# Patient Record
Sex: Male | Born: 1960 | Race: White | Hispanic: No | Marital: Married | Smoking: Never smoker
Health system: Southern US, Community
[De-identification: ages and names within clinical notes are randomized; demographics above are authoritative.]

## PROBLEM LIST (undated history)

## (undated) DIAGNOSIS — K219 Gastro-esophageal reflux disease without esophagitis: Secondary | ICD-10-CM

## (undated) DIAGNOSIS — Z460 Encounter for fitting and adjustment of spectacles and contact lenses: Secondary | ICD-10-CM

## (undated) DIAGNOSIS — M51369 Other intervertebral disc degeneration, lumbar region without mention of lumbar back pain or lower extremity pain: Secondary | ICD-10-CM

## (undated) DIAGNOSIS — T7840XA Allergy, unspecified, initial encounter: Secondary | ICD-10-CM

## (undated) DIAGNOSIS — M1990 Unspecified osteoarthritis, unspecified site: Secondary | ICD-10-CM

## (undated) DIAGNOSIS — M5136 Other intervertebral disc degeneration, lumbar region: Secondary | ICD-10-CM

## (undated) DIAGNOSIS — K449 Diaphragmatic hernia without obstruction or gangrene: Secondary | ICD-10-CM

## (undated) DIAGNOSIS — I10 Essential (primary) hypertension: Secondary | ICD-10-CM

## (undated) DIAGNOSIS — Z800 Family history of malignant neoplasm of digestive organs: Secondary | ICD-10-CM

## (undated) DIAGNOSIS — K469 Unspecified abdominal hernia without obstruction or gangrene: Secondary | ICD-10-CM

## (undated) HISTORY — PX: CYSTECTOMY: SUR359

## (undated) HISTORY — DX: Other intervertebral disc degeneration, lumbar region without mention of lumbar back pain or lower extremity pain: M51.369

## (undated) HISTORY — DX: Unspecified abdominal hernia without obstruction or gangrene: K46.9

## (undated) HISTORY — PX: OTHER SURGICAL HISTORY: SHX169

## (undated) HISTORY — PX: COLONOSCOPY: SHX174

## (undated) HISTORY — DX: Encounter for fitting and adjustment of spectacles and contact lenses: Z46.0

## (undated) HISTORY — DX: Allergy, unspecified, initial encounter: T78.40XA

## (undated) HISTORY — DX: Essential (primary) hypertension: I10

## (undated) HISTORY — DX: Diaphragmatic hernia without obstruction or gangrene: K44.9

## (undated) HISTORY — DX: Gastro-esophageal reflux disease without esophagitis: K21.9

## (undated) HISTORY — DX: Family history of malignant neoplasm of digestive organs: Z80.0

## (undated) HISTORY — DX: Other intervertebral disc degeneration, lumbar region: M51.36

---
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
## 2011-02-06 ENCOUNTER — Encounter: Payer: Self-pay | Admitting: Internal Medicine

## 2011-02-06 ENCOUNTER — Other Ambulatory Visit: Payer: Self-pay | Admitting: Internal Medicine

## 2011-02-06 DIAGNOSIS — R599 Enlarged lymph nodes, unspecified: Secondary | ICD-10-CM

## 2011-02-13 ENCOUNTER — Ambulatory Visit
Admission: RE | Admit: 2011-02-13 | Discharge: 2011-02-13 | Disposition: A | Discharge status: Home / Self Care | Payer: Managed Care, Other (non HMO) | Source: Ambulatory Visit | Attending: Internal Medicine | Admitting: Internal Medicine

## 2011-02-13 DIAGNOSIS — R599 Enlarged lymph nodes, unspecified: Secondary | ICD-10-CM

## 2011-02-13 MED ORDER — IOHEXOL 300 MG/ML  SOLN
75.0000 mL | Freq: Once | INTRAMUSCULAR | Status: AC | PRN
  Administered 2011-02-13: 75 mL via INTRAVENOUS

## 2011-03-03 ENCOUNTER — Ambulatory Visit (INDEPENDENT_AMBULATORY_CARE_PROVIDER_SITE_OTHER): Payer: Private Health Insurance - Indemnity | Admitting: General Surgery

## 2011-03-03 ENCOUNTER — Encounter (INDEPENDENT_AMBULATORY_CARE_PROVIDER_SITE_OTHER): Payer: Self-pay | Admitting: General Surgery

## 2011-03-03 VITALS — BP 126/84 | HR 64 | Temp 97.2°F | Ht 70.0 in | Wt 212.8 lb

## 2011-03-03 DIAGNOSIS — K439 Ventral hernia without obstruction or gangrene: Secondary | ICD-10-CM

## 2011-03-03 NOTE — Progress Notes (Signed)
Kevin Hoffman is a 50 y.o. male.    Chief Complaint  Patient presents with  . Other    Eval of ventral hernia    HPI HPI This patient was referred by Dr. Clelia Croft for evaluation of the bulge in his upper abdomen. He states that he noticed this approximately 2 years ago and he thinks that it is enlarging slightly. He has some discomfort in the area when doing exercises on his abdomen but otherwise this caused only mild discomfort and does not particularly bother him. He states it is easily reducible and denies any obstructive symptoms. He has no nausea or vomiting states that his bowels are normal he denies any blood in the stool or melena. He has not had a colonoscopy but is scheduled for a colonoscopy by GI soon. Her  Past Medical History  Diagnosis Date  . Hernia   . Contact lens/glasses fitting     Past Surgical History  Procedure Date  . Tonsilectomy, adenoidectomy, bilateral myringotomy and tubes 1978  . Cystectomy     fatty cyst removed from scalp    Family History  Problem Relation Age of Onset  . Hypertension Mother   . Cancer Mother   . Cancer Father     Social History History  Substance Use Topics  . Smoking status: Never Smoker   . Smokeless tobacco: Not on file  . Alcohol Use: No    Not on File  No current outpatient prescriptions on file.    Review of Systems Review of Systems  Constitutional: Negative.   HENT: Negative.   Eyes: Negative.   Respiratory: Negative.   Cardiovascular: Negative.   Gastrointestinal: Negative.   Genitourinary: Negative.   Musculoskeletal: Negative.   Skin: Negative.   Neurological: Negative.   Endo/Heme/Allergies: Negative.   Psychiatric/Behavioral: Negative.     Physical Exam Physical Exam  Constitutional: He is oriented to person, place, and time. He appears well-developed and well-nourished. No distress.  HENT:  Head: Normocephalic and atraumatic.  Eyes: EOM are normal. Pupils are equal, round, and reactive  to light. Right eye exhibits no discharge. Left eye exhibits no discharge. No scleral icterus.  Neck: Normal range of motion. No tracheal deviation present.  Cardiovascular: Normal rate, regular rhythm and normal heart sounds.   Respiratory: Effort normal and breath sounds normal. No stridor. No respiratory distress. He has no wheezes.  GI: Soft. Bowel sounds are normal. He exhibits no distension and no mass. There is no tenderness. There is no rebound and no guarding.       4-5cm reducible bulge in the epigastrium, Nontender, consistent with epigastric hernia  Musculoskeletal: Normal range of motion.  Neurological: He is alert and oriented to person, place, and time.  Skin: Skin is warm and dry. No rash noted. He is not diaphoretic. No erythema. No pallor.  Psychiatric: He has a normal mood and affect. His behavior is normal. Judgment and thought content normal.     Blood pressure 126/84, pulse 64, temperature 97.2 F (36.2 C), temperature source Temporal, height 5\' 10"  (1.778 m), weight 212 lb 12.8 oz (96.525 kg).  Assessment/Plan Reducible epigastric hernia  He has an easily reducible epigastric hernia on exam. I discussed the options of observation versus surgical repair and he would like to pursue surgical repair of this. We also discussed the options of laparoscopic versus open repair and given the location high in the subxiphoid region, I think that open repair might be a better option for him. We discussed  the risks of infection, bleeding, pain, scarring, recurrence, chronic pain, and the need for future surgery, and bowel injury. He elected to proceed with open epigastric hernia repair.  Lodema Pilot DAVID 03/03/2011, 5:58 PM

## 2011-03-16 ENCOUNTER — Encounter: Payer: Self-pay | Admitting: Internal Medicine

## 2011-03-16 ENCOUNTER — Ambulatory Visit (INDEPENDENT_AMBULATORY_CARE_PROVIDER_SITE_OTHER): Payer: Managed Care, Other (non HMO) | Admitting: Internal Medicine

## 2011-03-16 VITALS — BP 120/82 | HR 72 | Ht 70.0 in | Wt 211.4 lb

## 2011-03-16 DIAGNOSIS — Z800 Family history of malignant neoplasm of digestive organs: Secondary | ICD-10-CM

## 2011-03-16 DIAGNOSIS — K219 Gastro-esophageal reflux disease without esophagitis: Secondary | ICD-10-CM

## 2011-03-16 DIAGNOSIS — R198 Other specified symptoms and signs involving the digestive system and abdomen: Secondary | ICD-10-CM

## 2011-03-16 MED ORDER — PEG-KCL-NACL-NASULF-NA ASC-C 100 G PO SOLR: 1.0000 | Freq: Once | ORAL | Status: DC

## 2011-03-16 NOTE — Progress Notes (Signed)
HISTORY OF PRESENT ILLNESS:  Kevin Hoffman is a 50 y.o. male who presents today regarding several GI complaints and need for screening colonoscopy. Patient has a history of acid reflux for which he takes antacids several times per week. No dysphagia. Reports having undergone an upper GI series in 2006 at which time he was diagnosed with a hiatal hernia. He reports change in bowel habits over the past year. Specifically, less frequent bowel movements. As well some complaints of bloating and gas. Possible hemorrhoids without bleeding. Of importance, his mother was diagnosed with metastatic colon cancer last year. His maternal grandmother also has a history of colon cancer. The patient has not had prior endoscopic evaluations. Review of outside laboratories from 02/06/2011 reveal normal CBC, comprehensive metabolic panel, and sedimentation rate.  REVIEW OF SYSTEMS:  All non-GI ROS negative except for swollen lymph glands.  Past Medical History  Diagnosis Date  . Hernia   . Contact lens/glasses fitting   . Family history of colon cancer   . Hiatal hernia   . GERD (gastroesophageal reflux disease)   . DDD (degenerative disc disease)     Past Surgical History  Procedure Date  . Tonsilectomy, adenoidectomy, bilateral myringotomy and tubes 1978  . Cystectomy     fatty cyst removed from scalp    Social History Kevin Hoffman  reports that he has never smoked. He has never used smokeless tobacco. He reports that he drinks alcohol. He reports that he does not use illicit drugs.  family history includes Alcohol abuse in an unspecified family member; Breast cancer in his maternal grandmother; Colon cancer in his maternal grandmother and mother; Coronary artery disease in his paternal grandfather; Diabetes in his maternal grandfather; Heart disease in an unspecified family member; Hypertension in his mother; and Melanoma in his father.  No Known Allergies     PHYSICAL EXAMINATION: Vital  signs: BP 120/82  Pulse 72  Ht 5\' 10"  (1.778 m)  Wt 211 lb 6.4 oz (95.89 kg)  BMI 30.33 kg/m2  Constitutional: generally well-appearing, no acute distress Psychiatric: alert and oriented x3, cooperative Eyes: extraocular movements intact, anicteric, conjunctiva pink Mouth: oral pharynx moist, no lesions Neck: supple no lymphadenopathy Cardiovascular: heart regular rate and rhythm, no murmur Lungs: clear to auscultation bilaterally Abdomen: soft, nontender, nondistended, no obvious ascites, no peritoneal signs, normal bowel sounds, no organomegaly Rectal: Deferred until colonoscopy Extremities: no lower extremity edema bilaterally Skin: no lesions on visible extremities Neuro: No focal deficits.   ASSESSMENT:  #1. Family history of colon cancer in his mother at age 74 and grandmother #2. Nonspecific change in bowel habits, likely functional #3. Chronic GERD treated with on demand and acid several times per week   PLAN:  #1. Colonoscopy.The nature of the procedure, as well as the risks, benefits, and alternatives were carefully and thoroughly reviewed with the patient. Ample time for discussion and questions allowed. The patient understood, was satisfied, and agreed to proceed. Movi prep prescribed #2. Increase bulk fiber and water consumption #3. Upper endoscopy to screen for Barrett's esophagus.The nature of the procedure, as well as the risks, benefits, and alternatives were carefully and thoroughly reviewed with the patient. Ample time for discussion and questions allowed. The patient understood, was satisfied, and agreed to proceed.

## 2011-03-16 NOTE — Patient Instructions (Signed)
Colon/Endo [facility] 04/03/11 2:30 pm arrive at 1:30 pm Movi prep sent to your pharmacy  Colon/Endo brochures given for you to read.

## 2011-04-03 ENCOUNTER — Encounter: Payer: Self-pay | Admitting: Internal Medicine

## 2011-04-03 ENCOUNTER — Ambulatory Visit (AMBULATORY_SURGERY_CENTER): Payer: Managed Care, Other (non HMO) | Admitting: Internal Medicine

## 2011-04-03 DIAGNOSIS — R198 Other specified symptoms and signs involving the digestive system and abdomen: Secondary | ICD-10-CM

## 2011-04-03 DIAGNOSIS — D126 Benign neoplasm of colon, unspecified: Secondary | ICD-10-CM

## 2011-04-03 DIAGNOSIS — K219 Gastro-esophageal reflux disease without esophagitis: Secondary | ICD-10-CM

## 2011-04-03 DIAGNOSIS — Z800 Family history of malignant neoplasm of digestive organs: Secondary | ICD-10-CM

## 2011-04-03 DIAGNOSIS — Z1211 Encounter for screening for malignant neoplasm of colon: Secondary | ICD-10-CM

## 2011-04-03 MED ORDER — SODIUM CHLORIDE 0.9 % IV SOLN: 500.0000 mL | INTRAVENOUS | Status: DC

## 2011-04-03 MED ORDER — OMEPRAZOLE 40 MG PO CPDR: 40.0000 mg | DELAYED_RELEASE_CAPSULE | Freq: Every day | ORAL | Status: DC

## 2011-04-03 NOTE — Patient Instructions (Signed)
Please read the instructions given to you by your recovery room nurse.  Your pathology results will be mailed to you within 2 weeks.   You need to have another colonoscopy in 3 years due to the polyp amount today and your family history.   Take your new medication omeprazole 40mg  1/2 hr before breakfast every day.   You may resume your routine medications today.  You do need to increase the fiber in your diet.   Call us at [phone] if you have any questions or concerns.   Thank-you for choosing [place] for your healthcare needs today.

## 2011-04-06 ENCOUNTER — Telehealth: Payer: Self-pay | Admitting: *Deleted

## 2011-04-06 NOTE — Telephone Encounter (Signed)
Message left to call if necessary. 

## 2011-04-07 ENCOUNTER — Telehealth: Payer: Self-pay | Admitting: Internal Medicine

## 2011-04-11 ENCOUNTER — Emergency Department (HOSPITAL_COMMUNITY)
Admission: EM | Admit: 2011-04-11 | Discharge: 2011-04-11 | Disposition: A | Discharge status: Home / Self Care | Payer: Managed Care, Other (non HMO) | Attending: Emergency Medicine | Admitting: Emergency Medicine

## 2011-04-11 ENCOUNTER — Emergency Department (HOSPITAL_COMMUNITY): Payer: Managed Care, Other (non HMO)

## 2011-04-11 DIAGNOSIS — IMO0002 Reserved for concepts with insufficient information to code with codable children: Secondary | ICD-10-CM | POA: Insufficient documentation

## 2011-04-11 DIAGNOSIS — K219 Gastro-esophageal reflux disease without esophagitis: Secondary | ICD-10-CM | POA: Insufficient documentation

## 2011-04-11 DIAGNOSIS — Z79899 Other long term (current) drug therapy: Secondary | ICD-10-CM | POA: Insufficient documentation

## 2011-04-11 DIAGNOSIS — S6710XA Crushing injury of unspecified finger(s), initial encounter: Secondary | ICD-10-CM | POA: Insufficient documentation

## 2011-04-15 NOTE — Telephone Encounter (Signed)
Prior authorization sent to [facility] and it was the wrong provider.  The number patient gave me was to [employer].  I will call patient and obtain correct information or call the pharmacy.

## 2011-04-18 NOTE — Op Note (Signed)
Kevin, Hoffman NO.:  [id number]  MEDICAL RECORD NO.:  [id number]  LOCATION:  [facility]                         FACILITY:  [facility]  PHYSICIAN:  Dionne Ano. Adriane Guglielmo, M.D.DATE OF BIRTH:  04/02/1961  DATE OF PROCEDURE: DATE OF DISCHARGE:                              OPERATIVE REPORT   I had the pleasure to see Kevin Hoffman in the [facility] emergency room for evaluation of his left upper extremity.  This patient sustained a crushing injury to the left small finger with open distal phalanx fracture and nail bed, nail plate laceration.  He complains of pain here only.  He denies neck, back, chest, abdominal pain.  Notes no locking, popping, catching, or prior injury to the extremity.  He is awake, alert, and oriented and here with his wife.  I was asked to see him by Dr. Ethelda Chick.  PAST MEDICAL HISTORY:  Recent colonoscopy with noted gastroesophageal reflux disease.  He is currently on Prilosec.  PAST SURGICAL HISTORY:  Tonsillectomy, sebaceous cyst removed in the distant past.  CURRENT MEDICINES:  Prilosec.  ALLERGIES:  None.  He is a nonsmoker.  He has 3 children.  He is healthy 49-year right-hand- dominant male.  PHYSICAL EXAMINATION:  GENERAL:  He is alert and oriented in no acute distress. VITAL SIGNS:  Stable. HEENT:  Within normal limits. CHEST:  Clear. ABDOMEN:  Nontender. EXTREMITIES:  He has stable ligamentous examination about the upper wrist and forearm.  Lower extremity examination is benign.  Left upper extremity shows an open distal phalanx fracture with comminuted distal tip region.  There is no evidence of infection.  There was nail plate injury and nail bed laceration notable.  I have reviewed this at length and its findings.  X-rays show distal phalanx fracture displaced with the open injury. Impression:  Open distal phalanx fracture, left small finger with nail plate and nail bed disarray and an open  fracture.  RECOMMENDATIONS:  I have discussed with him plans for surgical intervention.  He concurred.  He understands risks and benefits as extensively discussed with him.  OPERATION IN DETAIL:  The patient was seen by myself, taken to the procedure suite and underwent intermetacarpal block.  Following this, prepped and draped in usual sterile fashion with 2 separate Betadine scrubs followed by placement of greater than 2 liters of irrigant through the wound.  Following this, the patient had sterile field secured.  Once this was done, I performed I and D of skin, subcutaneous tissue, bone, tendon, and periosteal tissue.  This was an excisional debridement.  Loose bone shards and fragments were removed with combination of curette, knife blade, and orthopedic scissors were used.  Following this, the patient then underwent very careful and cautious approach with nail plate removal.  This was accomplished out difficulty. Once this was done, I then performed open bone setting technique.  This was an open distal phalanx fracture treatment.  Following this, I then performed a volar flap advancement over the area as he had a very stellate punched out wound.  At this time performed nail bed reconstruction with 6-0 chromic suture under 4.0 magnification and repaired the lateral nail fold and the volar pulp with a  volar advancement technique.  He tolerated this well and had good refill at conclusion of the case.  Tourniquet time was less than 45 minutes to an hour.  Refill was excellent.  Adaptic was placed in the eponychium fold. I showed he and his wife the final product/finger.  Thus the operation consisted of: 1. I and D skin, subcutaneous tissue, bone, tendon, and periosteal     tissue.  This was an excisional debridement. 2. Nail plate removal. 3. Nail bed repair. 4. Open treatment, distal phalanx fracture. 5. Volar advancement flap secondary to the very stellate wound.  We     will  see him back in the office in 7 days, gently remove his     dressing, place him in a fingertip protector.  We will place him in     a long finger protector at night and a short DIP protector during     the day.  In addition to this, I will see him in 12 days for repeat     x-rays.  We will begin PIP range of motion during the day, protect     the DIP joint until the wound conditions are healed, then begin DIP     range of motion.  These notes have been discussed and all questions     encouraged and answered.     Dionne Ano. Amanda Pea, M.D.     [facility]  D:  04/11/2011  T:  04/11/2011  Job:  [id number]  Electronically Signed by Dominica Severin M.D. on 04/18/2011 05:30:29 PM

## 2011-04-28 NOTE — Telephone Encounter (Signed)
Called [facility] and received prior-auth for omeprazole 40mg  for 1 year. Per [employer] pt should be able to fill this in about when over-ride codes entered. Pts wife aware.

## 2011-06-30 ENCOUNTER — Encounter (HOSPITAL_COMMUNITY): Payer: Self-pay | Admitting: Pharmacy Technician

## 2011-07-02 ENCOUNTER — Encounter (HOSPITAL_COMMUNITY)
Admission: RE | Admit: 2011-07-02 | Discharge: 2011-07-02 | Disposition: A | Discharge status: Home / Self Care | Payer: Managed Care, Other (non HMO) | Source: Ambulatory Visit | Attending: General Surgery | Admitting: General Surgery

## 2011-07-02 ENCOUNTER — Encounter (HOSPITAL_COMMUNITY): Payer: Self-pay

## 2011-07-02 LAB — CBC
Hemoglobin: 16.3 g/dL (ref 13.0–17.0)
RBC: 5.18 MIL/uL (ref 4.22–5.81)

## 2011-07-02 LAB — SURGICAL PCR SCREEN
MRSA, PCR: NEGATIVE
Staphylococcus aureus: NEGATIVE

## 2011-07-02 NOTE — Patient Instructions (Signed)
20 GORDON VANDUNK  07/02/2011   Your procedure is scheduled on:  07/08/11 120pm-250pm  Report to [facility] at 1120 AM.  Call this number if you have problems the morning of surgery: [phone]   Remember:   Do not eat food:After Midnight.  May have clear liquids:May have clear liquids from 12 midnite until 0700 am then npo.   Clear liquids include soda, tea, black coffee, apple or grape juice, broth.  Take these medicines the morning of surgery with A SIP OF WATER:    Do not wear jewelry,.  Do not wear lotions, powders, or perfumes.     Do not bring valuables to the hospital.  Contacts, dentures or bridgework may not be worn into surgery.  Leave suitcase in the car. After surgery it may be brought to your room.  For patients admitted to the hospital, checkout time is 11:00 AM the day of discharge.      Special Instructions: CHG Shower Use Special Wash: 1/2 bottle night before surgery and 1/2 bottle morning of surgery. shower chin to toes Wash face and private parts with regular soap.    Please read over the following fact sheets that you were given: MRSA Information, coughing and deep breathing exercises and leg exercises.

## 2011-07-08 ENCOUNTER — Encounter (HOSPITAL_COMMUNITY): Payer: Self-pay | Admitting: *Deleted

## 2011-07-08 ENCOUNTER — Encounter (HOSPITAL_COMMUNITY)
Admission: RE | Disposition: A | Discharge status: Home / Self Care | Payer: Self-pay | Source: Ambulatory Visit | Attending: General Surgery

## 2011-07-08 ENCOUNTER — Encounter (HOSPITAL_COMMUNITY): Payer: Self-pay | Admitting: Anesthesiology

## 2011-07-08 ENCOUNTER — Ambulatory Visit (HOSPITAL_COMMUNITY): Payer: Managed Care, Other (non HMO) | Admitting: Anesthesiology

## 2011-07-08 ENCOUNTER — Ambulatory Visit (HOSPITAL_COMMUNITY)
Admission: RE | Admit: 2011-07-08 | Discharge: 2011-07-08 | Disposition: A | Discharge status: Home / Self Care | Payer: Managed Care, Other (non HMO) | Source: Ambulatory Visit | Attending: General Surgery | Admitting: General Surgery

## 2011-07-08 DIAGNOSIS — K439 Ventral hernia without obstruction or gangrene: Secondary | ICD-10-CM

## 2011-07-08 DIAGNOSIS — Z01812 Encounter for preprocedural laboratory examination: Secondary | ICD-10-CM | POA: Insufficient documentation

## 2011-07-08 HISTORY — DX: Unspecified osteoarthritis, unspecified site: M19.90

## 2011-07-08 HISTORY — PX: EPIGASTRIC HERNIA REPAIR: SHX404

## 2011-07-08 SURGERY — REPAIR, HERNIA, EPIGASTRIC, ADULT
Anesthesia: General | Site: Abdomen | Wound class: Clean

## 2011-07-08 MED ORDER — CEFAZOLIN SODIUM 1-5 GM-% IV SOLN
INTRAVENOUS | Status: DC | PRN
  Administered 2011-07-08: 2 g via INTRAVENOUS

## 2011-07-08 MED ORDER — HYDROCODONE-ACETAMINOPHEN 5-500 MG PO TABS: 1.0000 | ORAL_TABLET | ORAL | Status: AC | PRN

## 2011-07-08 MED ORDER — LIDOCAINE HCL (CARDIAC) 20 MG/ML IV SOLN
INTRAVENOUS | Status: DC | PRN
  Administered 2011-07-08: 70 mg via INTRAVENOUS

## 2011-07-08 MED ORDER — MEPERIDINE HCL 50 MG/ML IJ SOLN: 6.2500 mg | INTRAMUSCULAR | Status: DC | PRN

## 2011-07-08 MED ORDER — GLYCOPYRROLATE 0.2 MG/ML IJ SOLN
INTRAMUSCULAR | Status: DC | PRN
  Administered 2011-07-08: .6 mg via INTRAVENOUS

## 2011-07-08 MED ORDER — 0.9 % SODIUM CHLORIDE (POUR BTL) OPTIME
TOPICAL | Status: DC | PRN
  Administered 2011-07-08: 1000 mL

## 2011-07-08 MED ORDER — NEOSTIGMINE METHYLSULFATE 1 MG/ML IJ SOLN
INTRAMUSCULAR | Status: DC | PRN
  Administered 2011-07-08: 4 mg via INTRAVENOUS

## 2011-07-08 MED ORDER — PROPOFOL 10 MG/ML IV EMUL
INTRAVENOUS | Status: DC | PRN
  Administered 2011-07-08: 300 mg via INTRAVENOUS

## 2011-07-08 MED ORDER — LIDOCAINE-EPINEPHRINE 1 %-1:100000 IJ SOLN
INTRAMUSCULAR | Status: AC
  Filled 2011-07-08: qty 1

## 2011-07-08 MED ORDER — FENTANYL CITRATE 0.05 MG/ML IJ SOLN
INTRAMUSCULAR | Status: DC | PRN
  Administered 2011-07-08: 50 ug via INTRAVENOUS
  Administered 2011-07-08 (×2): 100 ug via INTRAVENOUS

## 2011-07-08 MED ORDER — LACTATED RINGERS IV SOLN
INTRAVENOUS | Status: DC
  Administered 2011-07-08 (×2): via INTRAVENOUS
  Administered 2011-07-08: 1000 mL via INTRAVENOUS

## 2011-07-08 MED ORDER — MIDAZOLAM HCL 5 MG/5ML IJ SOLN
INTRAMUSCULAR | Status: DC | PRN
  Administered 2011-07-08: 2 mg via INTRAVENOUS

## 2011-07-08 MED ORDER — FENTANYL CITRATE 0.05 MG/ML IJ SOLN: 25.0000 ug | INTRAMUSCULAR | Status: DC | PRN

## 2011-07-08 MED ORDER — CEFAZOLIN SODIUM-DEXTROSE 2-3 GM-% IV SOLR: 2.0000 g | INTRAVENOUS | Status: DC

## 2011-07-08 MED ORDER — ONDANSETRON HCL 4 MG/2ML IJ SOLN
INTRAMUSCULAR | Status: DC | PRN
  Administered 2011-07-08: 4 mg via INTRAVENOUS

## 2011-07-08 MED ORDER — BUPIVACAINE HCL (PF) 0.25 % IJ SOLN
INTRAMUSCULAR | Status: AC
  Filled 2011-07-08: qty 30

## 2011-07-08 MED ORDER — ROCURONIUM BROMIDE 100 MG/10ML IV SOLN
INTRAVENOUS | Status: DC | PRN
  Administered 2011-07-08: 40 mg via INTRAVENOUS

## 2011-07-08 MED ORDER — PROMETHAZINE HCL 25 MG/ML IJ SOLN: 6.2500 mg | INTRAMUSCULAR | Status: DC | PRN

## 2011-07-08 MED ORDER — BUPIVACAINE HCL (PF) 0.25 % IJ SOLN
INTRAMUSCULAR | Status: DC | PRN
  Administered 2011-07-08: 20 mL

## 2011-07-08 MED ORDER — LIDOCAINE-EPINEPHRINE 1 %-1:100000 IJ SOLN
INTRAMUSCULAR | Status: DC | PRN
  Administered 2011-07-08: 20 mL

## 2011-07-08 MED ORDER — LACTATED RINGERS IV SOLN: INTRAVENOUS | Status: DC

## 2011-07-08 MED ORDER — DEXAMETHASONE SODIUM PHOSPHATE 10 MG/ML IJ SOLN
INTRAMUSCULAR | Status: DC | PRN
  Administered 2011-07-08: 10 mg via INTRAVENOUS

## 2011-07-08 SURGICAL SUPPLY — 36 items
BINDER ABD UNIV 12 45-62 (WOUND CARE) IMPLANT
BINDER ABDOMINAL 46IN 62IN (WOUND CARE)
BLADE EXTENDED COATED 6.5IN (ELECTRODE) IMPLANT
BLADE HEX COATED 2.75 (ELECTRODE) ×2 IMPLANT
CANISTER SUCTION 2500CC (MISCELLANEOUS) ×2 IMPLANT
CLOTH BEACON ORANGE TIMEOUT ST (SAFETY) IMPLANT
COVER SURGICAL LIGHT HANDLE (MISCELLANEOUS) ×2 IMPLANT
DECANTER SPIKE VIAL GLASS SM (MISCELLANEOUS) ×2 IMPLANT
DERMABOND ADVANCED (GAUZE/BANDAGES/DRESSINGS) ×1
DERMABOND ADVANCED .7 DNX12 (GAUZE/BANDAGES/DRESSINGS) ×1 IMPLANT
DRAPE LAPAROSCOPIC ABDOMINAL (DRAPES) ×2 IMPLANT
ELECT REM PT RETURN 9FT ADLT (ELECTROSURGICAL) ×2
ELECTRODE REM PT RTRN 9FT ADLT (ELECTROSURGICAL) ×1 IMPLANT
GLOVE BIOGEL PI IND STRL 7.0 (GLOVE) ×1 IMPLANT
GLOVE BIOGEL PI INDICATOR 7.0 (GLOVE) ×1
GLOVE SURG SIGNA 7.5 PF LTX (GLOVE) ×2 IMPLANT
GOWN STRL NON-REIN LRG LVL3 (GOWN DISPOSABLE) ×2 IMPLANT
GOWN STRL REIN XL XLG (GOWN DISPOSABLE) ×4 IMPLANT
HAND ACTIVATED (MISCELLANEOUS) IMPLANT
KIT BASIN OR (CUSTOM PROCEDURE TRAY) ×2 IMPLANT
NEEDLE HYPO 22GX1.5 SAFETY (NEEDLE) ×2 IMPLANT
NS IRRIG 1000ML POUR BTL (IV SOLUTION) ×2 IMPLANT
PACK GENERAL/GYN (CUSTOM PROCEDURE TRAY) ×2 IMPLANT
PATCH VENTRAL SMALL 4.3 (Mesh Specialty) ×2 IMPLANT
SPONGE GAUZE 4X4 12PLY (GAUZE/BANDAGES/DRESSINGS) ×2 IMPLANT
STAPLER VISISTAT 35W (STAPLE) ×2 IMPLANT
SUT ETHIBOND 0 MO6 C/R (SUTURE) ×8 IMPLANT
SUT NOVA NAB GS-22 2 0 T19 (SUTURE) IMPLANT
SUT PDS AB 1 CTX 36 (SUTURE) IMPLANT
SUT PROLENE 0 CT 2 (SUTURE) IMPLANT
SUT PROLENE 2 0 BLUE (SUTURE) ×2 IMPLANT
SUT SILK 2 0 (SUTURE)
SUT SILK 2-0 18XBRD TIE 12 (SUTURE) IMPLANT
SYR CONTROL 10ML LL (SYRINGE) ×2 IMPLANT
TOWEL OR 17X26 10 PK STRL BLUE (TOWEL DISPOSABLE) ×4 IMPLANT
TRAY FOLEY CATH 14FRSI W/METER (CATHETERS) IMPLANT

## 2011-07-08 NOTE — Brief Op Note (Signed)
07/08/2011  3:54 PM  PATIENT:  Kevin Hoffman  50 y.o. male  PRE-OPERATIVE DIAGNOSIS:  epigastric hernia  POST-OPERATIVE DIAGNOSIS:  epigastric hernia  PROCEDURE:  Procedure(s): HERNIA REPAIR EPIGASTRIC ADULT  SURGEON:  Surgeon(s): Rulon Abide, DO  PHYSICIAN ASSISTANT:   ASSISTANTS: none   ANESTHESIA:   general  EBL:  Total I/O In: 1200 [I.V.:1200] Out: 310 [Urine:300; Blood:10]  BLOOD ADMINISTERED:none  DRAINS: none   LOCAL MEDICATIONS USED:  MARCAINE 20CC and LIDOCAINE 20CC  SPECIMEN:  No Specimen  DISPOSITION OF SPECIMEN:  N/A  COUNTS:  YES  TOURNIQUET:  * No tourniquets in log *  DICTATION: .Other Dictation: Dictation Number [id number]  PLAN OF CARE: Discharge to home after PACU  PATIENT DISPOSITION:  PACU - hemodynamically stable.   Delay start of Pharmacological VTE agent (>24hrs) due to surgical blood loss or risk of bleeding:  {YES/NO/NOT APPLICABLE:20182

## 2011-07-08 NOTE — Op Note (Signed)
Kevin Hoffman, Kevin NO.:  [id number]  MEDICAL RECORD NO.:  [id number]  LOCATION:  [facility]                         FACILITY:  [facility]  PHYSICIAN:  Lodema Pilot, MD       DATE OF BIRTH:  1961/03/26  DATE OF PROCEDURE:  07/08/2011 DATE OF DISCHARGE:  07/08/2011                              OPERATIVE REPORT   PROCEDURE:  Open epigastric hernia repair with mesh.  PREOPERATIVE DIAGNOSIS:  Epigastric hernia.  POSTOPERATIVE DIAGNOSIS:  Epigastric hernia.  SURGEON:  Lodema Pilot, MD  ASSISTANT:  None.  ANESTHESIA:  General endotracheal anesthesia with 40 cc of 1% lidocaine with epinephrine, 0.25% Marcaine in a 50/50 mixture.  FLUIDS:  1200 cc of crystalloid.  ESTIMATED BLOOD LOSS:  Minimal.  DRAINS:  None.  SPECIMENS:  None.  COMPLICATIONS:  None apparent.  FINDINGS:  A 1.5 cm fascial defect with a reducible preperitoneal fat and placement of 4.3 cm x 4.3 cm Proceed ventral patch.  INDICATION OF PROCEDURE:  Kevin Hoffman is a 50 year old male with a reducible epigastric hernia who has epigastric hernia on exam and desires repair.  OPERATIVE DETAILS:  Kevin Hoffman was seen and evaluated in the preoperative area and risks and benefits of the procedure were again discussed in lay terms and informed consent was obtained.  Surgical site was marked to the patient preoperatively, given prophylactic antibiotics, taken to the operating room, and placed on table in supine position.  General endotracheal anesthesia was obtained and his abdomen was prepped and draped in a standard surgical fashion.  Midline incision was made in the skin and dissection carried down to the subcutaneous tissue using Bovie electrocautery.  Immediately underlying the skin and subcutaneous tissue, was a hernia sac which contained fair amount of preperitoneal fat which was herniated through the fascial defect.  This was dissected circumferentially and the fatty contents were reduced  back into the preperitoneal space and he was left with approximately 1.5 cm fascial defect.  The fascial edges appeared clean and well defined and appeared to have strong fascia.  The fascia was cleared circumferentially and the undersurface of the fascia was cleared circumferentially using blunt dissection.  I decided to place a 4.3 cm x 4.3 cm Proceed ventral patch into the preperitoneal space to minimize the chance of recurrence and 2-0 Prolene sutures were placed at the 12 o'clock, 3 o'clock, 6 o'clock position on the mesh and the repair shooted up through the abdominal wall fascia under direct visualization, taking care to avoid injury to underlying bowel contents.  Each suture was placed under direct visualization and again the peritoneum was not entered.  The patch was placed in the preperitoneal space and sutures were pulled tight and secured and the mesh appeared to lie flat up against the undersurface of the abdominal wall fascia.  The sutures again were secured and there was no evidence of bowel contents 2 sutures were herniating up around through the hernia patch and the tails of the patch were cut and 0 Ethibond sutures were placed through the fascia, then through the tails of the mesh and through the patch centrally and the hernia defect was closed with multiple interrupted 0 Ethibond sutures.  The fascia  was well approximated over the mesh and the wound was inspected for hemostasis which was noted to be adequate.  It was irrigated with sterile saline solution and the wound was injected with 40 cc of 1% lidocaine with epinephrine, 0.25% Marcaine in a 50/50 mixture.  Then the space which was previously filled with hernia contents was obliterated with Bovie electrocautery and a couple of 3-0 Vicryl sutures were placed to approximate the subcutaneous tissues and closed the dead space.  Few interrupted dermal sutures were placed with 3-0 Vicryl suture and the skin edges were  approximated with 4-0 Monocryl subcuticular suture.  Skin was washed and dried and Dermabond was applied.  All sponge, needle, and instrument counts were correct at the end of the case.  The patient tolerated the procedure well without apparent complication.          ______________________________ Lodema Pilot, MD     BL/MEDQ  D:  07/08/2011  T:  07/08/2011  Job:  [id number]

## 2011-07-08 NOTE — Anesthesia Preprocedure Evaluation (Signed)
Anesthesia Evaluation  Patient identified by MRN, date of birth, ID band Patient awake    Reviewed: Allergy & Precautions, H&P , NPO status , Patient's Chart, lab work & pertinent test results  Airway Mallampati: II TM Distance: >3 FB Neck ROM: Full    Dental No notable dental hx.    Pulmonary neg pulmonary ROS,  clear to auscultation  Pulmonary exam normal       Cardiovascular neg cardio ROS Regular Normal    Neuro/Psych Negative Neurological ROS  Negative Psych ROS   GI/Hepatic negative GI ROS, Neg liver ROS, hiatal hernia, GERD-  Medicated and Controlled,  Endo/Other  Negative Endocrine ROS  Renal/GU negative Renal ROS  Genitourinary negative   Musculoskeletal negative musculoskeletal ROS (+)   Abdominal   Peds negative pediatric ROS (+)  Hematology negative hematology ROS (+)   Anesthesia Other Findings   Reproductive/Obstetrics negative OB ROS                           Anesthesia Physical Anesthesia Plan  ASA: II  Anesthesia Plan: General   Post-op Pain Management:    Induction: Intravenous  Airway Management Planned: Oral ETT  Additional Equipment:   Intra-op Plan:   Post-operative Plan: Extubation in OR  Informed Consent: I have reviewed the patients History and Physical, chart, labs and discussed the procedure including the risks, benefits and alternatives for the proposed anesthesia with the patient or authorized representative who has indicated his/her understanding and acceptance.   Dental advisory given  Plan Discussed with: CRNA  Anesthesia Plan Comments:         Anesthesia Quick Evaluation

## 2011-07-08 NOTE — Transfer of Care (Signed)
Immediate Anesthesia Transfer of Care Note  Patient: Kevin Hoffman  Procedure(s) Performed:  HERNIA REPAIR EPIGASTRIC ADULT - with mesh  Patient Location: PACU  Anesthesia Type: General  Level of Consciousness: sedated  Airway & Oxygen Therapy: Patient Spontanous Breathing and Patient connected to face mask  Post-op Assessment: Report given to PACU RN and Post -op Vital signs reviewed and stable  Post vital signs: Reviewed and stable  Complications: No apparent anesthesia complications

## 2011-07-08 NOTE — Anesthesia Postprocedure Evaluation (Signed)
  Anesthesia Post-op Note  Patient: Kevin Hoffman  Procedure(s) Performed:  HERNIA REPAIR EPIGASTRIC ADULT - with mesh  Patient Location: PACU  Anesthesia Type: General  Level of Consciousness: awake and alert   Airway and Oxygen Therapy: Patient Spontanous Breathing  Post-op Pain: mild  Post-op Assessment: Post-op Vital signs reviewed, Patient's Cardiovascular Status Stable, Respiratory Function Stable, Patent Airway and No signs of Nausea or vomiting  Post-op Vital Signs: stable  Complications: No apparent anesthesia complications

## 2011-07-08 NOTE — H&P (Signed)
  HPI  HPI  This patient was referred by Dr. Clelia Croft for evaluation of the bulge in his upper abdomen. He states that he noticed this approximately 2 years ago and he thinks that it is enlarging slightly. He has some discomfort in the area when doing exercises on his abdomen but otherwise this caused only mild discomfort and does not particularly bother him. He states it is easily reducible and denies any obstructive symptoms. He has no nausea or vomiting states that his bowels are normal he denies any blood in the stool or melena. He has not had a colonoscopy but is scheduled for a colonoscopy by GI soon. Her  Past Medical History   Diagnosis  Date   .  Hernia    .  Contact lens/glasses fitting     Past Surgical History   Procedure  Date   .  Tonsilectomy, adenoidectomy, bilateral myringotomy and tubes  1978   .  Cystectomy      fatty cyst removed from scalp    Family History   Problem  Relation  Age of Onset   .  Hypertension  Mother    .  Cancer  Mother    .  Cancer  Father    Social History  History   Substance Use Topics   .  Smoking status:  Never Smoker   .  Smokeless tobacco:  Not on file   .  Alcohol Use:  No   Not on File  No current outpatient prescriptions on file.   Review of Systems  Review of Systems  Constitutional: Negative.  HENT: Negative.  Eyes: Negative.  Respiratory: Negative.  Cardiovascular: Negative.  Gastrointestinal: Negative.  Genitourinary: Negative.  Musculoskeletal: Negative.  Skin: Negative.  Neurological: Negative.  Endo/Heme/Allergies: Negative.  Psychiatric/Behavioral: Negative.  Physical Exam  Physical Exam  Constitutional: He is oriented to person, place, and time. He appears well-developed and well-nourished. No distress.  HENT:  Head: Normocephalic and atraumatic.  Eyes: EOM are normal. Pupils are equal, round, and reactive to light. Right eye exhibits no discharge. Left eye exhibits no discharge. No scleral icterus.  Neck: Normal  range of motion. No tracheal deviation present.  Cardiovascular: Normal rate, regular rhythm and normal heart sounds.  Respiratory: Effort normal and breath sounds normal. No stridor. No respiratory distress. He has no wheezes.  GI: Soft. Bowel sounds are normal. He exhibits no distension and no mass. There is no tenderness. There is no rebound and no guarding.  4-5cm reducible bulge in the epigastrium, Nontender, consistent with epigastric hernia  Musculoskeletal: Normal range of motion.  Neurological: He is alert and oriented to person, place, and time.  Skin: Skin is warm and dry. No rash noted. He is not diaphoretic. No erythema. No pallor.  Psychiatric: He has a normal mood and affect. His behavior is normal. Judgment and thought content normal.    Assessment/Plan  Reducible epigastric hernia Pt seen and examined in the preop area. Risks of procedure discussed in lay terms and site marked with the patient and he desires to proceed with open epigastric hernia repair with mesh.  Risks of infection, bleeding, pain, scarring, recurrence, injury to bowel discussed and he desires to proceed with planned procedure.

## 2011-07-10 ENCOUNTER — Encounter (HOSPITAL_COMMUNITY): Payer: Self-pay | Admitting: General Surgery

## 2011-08-04 ENCOUNTER — Encounter (INDEPENDENT_AMBULATORY_CARE_PROVIDER_SITE_OTHER): Payer: Self-pay | Admitting: General Surgery

## 2011-08-04 ENCOUNTER — Ambulatory Visit (INDEPENDENT_AMBULATORY_CARE_PROVIDER_SITE_OTHER): Payer: Managed Care, Other (non HMO) | Admitting: General Surgery

## 2011-08-04 VITALS — BP 118/76 | HR 68 | Temp 97.6°F | Resp 16 | Ht 70.0 in | Wt 211.6 lb

## 2011-08-04 DIAGNOSIS — Z5189 Encounter for other specified aftercare: Secondary | ICD-10-CM

## 2011-08-04 DIAGNOSIS — Z4889 Encounter for other specified surgical aftercare: Secondary | ICD-10-CM

## 2011-08-04 NOTE — Progress Notes (Signed)
Subjective:     Patient ID: Kevin Hoffman, male   DOB: 1961-04-03, 51 y.o.   MRN: [id number]  HPI He follows up today status post epigastric hernia repair with mesh. He has no complaints and is doing very well. Is not taking any pain medication and he is tolerating regular diet and his bowels are functioning normally.  Review of Systems     Objective:   Physical Exam His incision is healing well without sign of infection no evidence of recurrent hernia.    Assessment:     Status post epigastric hernia repair with mesh-doing well    Plan:     History very well has no complaints he can gradually increase activity as tolerated and return to see me on a p.r.n. basis.

## 2011-09-21 ENCOUNTER — Other Ambulatory Visit: Payer: Self-pay

## 2011-09-21 MED ORDER — OMEPRAZOLE 40 MG PO CPDR: 40.0000 mg | DELAYED_RELEASE_CAPSULE | Freq: Every day | ORAL | Status: DC

## 2012-05-02 ENCOUNTER — Emergency Department (HOSPITAL_COMMUNITY)
Admission: EM | Admit: 2012-05-02 | Discharge: 2012-05-02 | Disposition: A | Discharge status: Home / Self Care | Payer: Managed Care, Other (non HMO) | Attending: Emergency Medicine | Admitting: Emergency Medicine

## 2012-05-02 ENCOUNTER — Emergency Department (HOSPITAL_COMMUNITY): Payer: Managed Care, Other (non HMO)

## 2012-05-02 ENCOUNTER — Encounter (HOSPITAL_COMMUNITY): Payer: Self-pay | Admitting: *Deleted

## 2012-05-02 DIAGNOSIS — R079 Chest pain, unspecified: Secondary | ICD-10-CM | POA: Insufficient documentation

## 2012-05-02 DIAGNOSIS — K219 Gastro-esophageal reflux disease without esophagitis: Secondary | ICD-10-CM | POA: Insufficient documentation

## 2012-05-02 LAB — POCT I-STAT, CHEM 8
BUN: 11 mg/dL (ref 6–23)
Calcium, Ion: 1.2 mmol/L (ref 1.12–1.23)
Chloride: 101 mEq/L (ref 96–112)
Creatinine, Ser: 1 mg/dL (ref 0.50–1.35)
Glucose, Bld: 96 mg/dL (ref 70–99)
HCT: 45 % (ref 39.0–52.0)

## 2012-05-02 LAB — CBC WITH DIFFERENTIAL/PLATELET
Basophils Relative: 0 % (ref 0–1)
MCV: 83.2 fL (ref 78.0–100.0)
Monocytes Absolute: 0.3 10*3/uL (ref 0.1–1.0)
Monocytes Relative: 6 % (ref 3–12)
Neutrophils Relative %: 64 % (ref 43–77)
Platelets: 181 10*3/uL (ref 150–400)
RBC: 5.18 MIL/uL (ref 4.22–5.81)
RDW: 11.8 % (ref 11.5–15.5)
WBC: 4.4 10*3/uL (ref 4.0–10.5)

## 2012-05-02 MED ORDER — NITROGLYCERIN 0.4 MG SL SUBL
0.4000 mg | SUBLINGUAL_TABLET | Freq: Once | SUBLINGUAL | Status: AC
  Administered 2012-05-02: 0.4 mg via SUBLINGUAL

## 2012-05-02 MED ORDER — NITROGLYCERIN 0.4 MG SL SUBL
SUBLINGUAL_TABLET | SUBLINGUAL | Status: AC
  Administered 2012-05-02: 0.4 mg via SUBLINGUAL
  Filled 2012-05-02: qty 25

## 2012-05-02 MED ORDER — METOPROLOL TARTRATE 25 MG PO TABS
100.0000 mg | ORAL_TABLET | Freq: Once | ORAL | Status: AC
  Administered 2012-05-02: 100 mg via ORAL
  Filled 2012-05-02: qty 4

## 2012-05-02 MED ORDER — METOPROLOL TARTRATE 1 MG/ML IV SOLN
INTRAVENOUS | Status: AC
  Filled 2012-05-02: qty 10

## 2012-05-02 MED ORDER — IOHEXOL 350 MG/ML SOLN
80.0000 mL | Freq: Once | INTRAVENOUS | Status: AC | PRN
  Administered 2012-05-02: 80 mL via INTRAVENOUS

## 2012-05-02 NOTE — ED Provider Notes (Signed)
History     CSN: [id number]  Arrival date & time 05/02/12  1103   First MD Initiated Contact with Patient 05/02/12 1217      Chief Complaint  Patient presents with  . Chest Pain    (Consider location/radiation/quality/duration/timing/severity/associated sxs/prior treatment) HPI Pt with no prior CAD history reports he was at work today when he began to feel SOB and tingling. He then had about 10-15 minutes of midsternal moderate chest pain/pressure which has since resolved. He is asymptomatic in the ED. No particular provoking or relieving factors.   Past Medical History  Diagnosis Date  . Hernia   . Contact lens/glasses fitting   . Family history of colon cancer   . Hiatal hernia   . GERD (gastroesophageal reflux disease)   . Arthritis     Past Surgical History  Procedure Date  . Tonsilectomy, adenoidectomy, bilateral myringotomy and tubes 1978  . Cystectomy     fatty cyst removed from scalp  . Other surgical history     left 5th finger crushed injury surgery in ER   . Epigastric hernia repair 07/08/2011    Procedure: HERNIA REPAIR EPIGASTRIC ADULT;  Surgeon: Rulon Abide, DO;  Location: [facility];  Service: General;  Laterality: N/A;  with mesh    Family History  Problem Relation Age of Onset  . Hypertension Mother   . Colon cancer Mother     Maternal Grandmother  . Alcohol abuse    . Breast cancer Maternal Grandmother   . Colon cancer Maternal Grandmother   . Coronary artery disease Paternal Grandfather   . Diabetes Maternal Grandfather   . Melanoma Father   . Heart disease      Paternal    History  Substance Use Topics  . Smoking status: Never Smoker   . Smokeless tobacco: Never Used  . Alcohol Use: 6.0 oz/week    10 Glasses of wine per week     occ      Review of Systems All other systems reviewed and are negative except as noted in HPI.   Allergies  Review of patient's allergies indicates no known allergies.  Home Medications   Current  Outpatient Rx  Name Route Sig Dispense Refill  . VITAMIN C 1000 MG PO TABS Oral Take 1,000 mg by mouth daily.     . IBUPROFEN 200 MG PO TABS Oral Take 400-600 mg by mouth every 6 (six) hours as needed. PAIN     . LOTEMAX 0.5 % OP OINT  as needed.    . MELOXICAM 15 MG PO TABS Oral Take 15 mg by mouth daily as needed. PAIN     . NAPHAZOLINE HCL 0.012 % OP SOLN Both Eyes Place 1-2 drops into both eyes 2 (two) times daily as needed. DRY EYES     . OMEPRAZOLE 40 MG PO CPDR Oral Take 1 capsule (40 mg total) by mouth daily. Take 1/2 hr before breakfast 90 capsule 1    BP 134/86  Pulse 71  Temp 98 F (36.7 C) (Oral)  Resp 10  Ht 5\' 9"  (1.753 m)  Wt 218 lb (98.884 kg)  BMI 32.19 kg/m2  SpO2 99%  Physical Exam  Nursing note and vitals reviewed. Constitutional: He is oriented to person, place, and time. He appears well-developed and well-nourished.  HENT:  Head: Normocephalic and atraumatic.  Eyes: EOM are normal. Pupils are equal, round, and reactive to light.  Neck: Normal range of motion. Neck supple.  Cardiovascular: Normal rate, normal  heart sounds and intact distal pulses.   Pulmonary/Chest: Effort normal and breath sounds normal.  Abdominal: Bowel sounds are normal. He exhibits no distension. There is no tenderness.  Musculoskeletal: Normal range of motion. He exhibits no edema and no tenderness.  Neurological: He is alert and oriented to person, place, and time. He has normal strength. No cranial nerve deficit or sensory deficit.  Skin: Skin is warm and dry. No rash noted.  Psychiatric: He has a normal mood and affect.    ED Course  Procedures (including critical care time)  Labs Reviewed  CBC WITH DIFFERENTIAL - Abnormal; Notable for the following:    MCHC 37.6 (*)  RULED OUT INTERFERING SUBSTANCES   All other components within normal limits  POCT I-STAT, CHEM 8  POCT I-STAT TROPONIN I  TROPONIN I   Dg Chest 2 View  05/02/2012  *RADIOLOGY REPORT*  Clinical Data:  Chest pain and cough.  CHEST - 2 VIEW  Comparison: None  Findings: The cardiac silhouette, mediastinal and hilar contours are normal.  The lungs are clear.  No pleural effusion.  The bony thorax is intact.  IMPRESSION: Normal chest x-ray.   Original Report Authenticated By: P. Loralie Champagne, M.D.      No diagnosis found.    MDM   Date: 05/02/2012  Rate: 78  Rhythm: normal sinus rhythm  QRS Axis: normal  Intervals: normal  ST/T Wave abnormalities: normal  Conduction Disutrbances:iRBBB  Narrative Interpretation:   Old EKG Reviewed: none available   3:20 PM PT with normal Trop, unremarkable EKG. Discussed further ED evaluation with the patient who agrees to stay here for CT Coronary if it can be done today. Moved to CDU, discussed with Laveda Norman, [place]       Bonnita Levan. Bernette Mayers, MD 05/02/12 1521

## 2012-05-02 NOTE — ED Provider Notes (Signed)
Patient has completed his coronary CT--results as follows:    1. Negative for coronary artery disease. The patient's total coronary artery calcium score is 0, which is 0 percentile for patient's matched age and gender. 2. No significant extracardiac abnormality. 3. Right coronary artery dominance.   Patient remains symptom-free.  Cardiac markers negative.  I-stat 8 within normal limits.  Results reviewed with Dr. Bernette Mayers, shared with patient.  Patient's only current history is GERD, for which he takes prilosec.  Clelia Croft is his PCP.  Patient resting comfortably on exam.  Lungs CTA bilaterally.  S1/S2, RRR.  Abdomen soft, bowel sounds present.  Monitor reveals NSR without ectopy.  Patient will be discharged home with follow-up by his PCP.   Jimmye Norman, NP 05/02/12 [phone]

## 2012-05-02 NOTE — ED Notes (Signed)
Patient states no pain at this time , patient states no pain post initial attack, patient resting in NAD at this time

## 2012-05-02 NOTE — ED Notes (Signed)
PT RETURNED FROM CT WITH RN. TOLERATED WELL

## 2012-05-02 NOTE — ED Provider Notes (Signed)
patient care report received from Dr. Bernette Mayers. Patient is being sent to CDU while waiting for chest pain workup with coronary CT scan today. Initially presents with shortness of breath and chest discomfort. This symptom has resolved.  On examination he appeared in good health and spirits. Vital signs as documented. Skin warm and dry and without overt rashes. Neck without JVD. Lungs clear. Heart exam notable for regular rhythm, normal sounds and absence of murmurs, rubs or gallops. Abdomen unremarkable and without evidence of organomegaly, masses, or abdominal aortic enlargement. Extremities nonedematous.  3:23 PM Report given to oncoming CDU provider, who will continue pt care.  BP 126/82  Pulse 60  Temp 98 F (36.7 C) (Oral)  Resp 17  Ht 5\' 9"  (1.753 m)  Wt 218 lb (98.884 kg)  BMI 32.19 kg/m2  SpO2 97%  Nursing notes reviewed and considered in documentation  Previous records reviewed and considered  All labs/vitals reviewed and considered  xrays reviewed and considered   Results for orders placed during the hospital encounter of 05/02/12  CBC WITH DIFFERENTIAL      Component Value Range   WBC 4.4  4.0 - 10.5 K/uL   RBC 5.18  4.22 - 5.81 MIL/uL   Hemoglobin 16.2  13.0 - 17.0 g/dL   HCT 16.1  09.6 - 04.5 %   MCV 83.2  78.0 - 100.0 fL   MCH 31.3  26.0 - 34.0 pg   MCHC 37.6 (*) 30.0 - 36.0 g/dL   RDW 40.9  81.1 - 91.4 %   Platelets 181  150 - 400 K/uL   Neutrophils Relative 64  43 - 77 %   Neutro Abs 2.8  1.7 - 7.7 K/uL   Lymphocytes Relative 29  12 - 46 %   Lymphs Abs 1.3  0.7 - 4.0 K/uL   Monocytes Relative 6  3 - 12 %   Monocytes Absolute 0.3  0.1 - 1.0 K/uL   Eosinophils Relative 1  0 - 5 %   Eosinophils Absolute 0.0  0.0 - 0.7 K/uL   Basophils Relative 0  0 - 1 %   Basophils Absolute 0.0  0.0 - 0.1 K/uL  POCT I-STAT, CHEM 8      Component Value Range   Sodium 139  135 - 145 mEq/L   Potassium 4.1  3.5 - 5.1 mEq/L   Chloride 101  96 - 112 mEq/L   BUN 11  6 - 23  mg/dL   Creatinine, Ser 7.82  0.50 - 1.35 mg/dL   Glucose, Bld 96  70 - 99 mg/dL   Calcium, Ion 9.56  1.12 - 1.23 mmol/L   TCO2 25  0 - 100 mmol/L   Hemoglobin 15.3  13.0 - 17.0 g/dL   HCT 21.3  08.6 - 57.8 %  POCT I-STAT TROPONIN I      Component Value Range   Troponin i, poc 0.00  0.00 - 0.08 ng/mL   Comment 3           TROPONIN I      Component Value Range   Troponin I <0.30  <0.30 ng/mL   Dg Chest 2 View  05/02/2012  *RADIOLOGY REPORT*  Clinical Data: Chest pain and cough.  CHEST - 2 VIEW  Comparison: None  Findings: The cardiac silhouette, mediastinal and hilar contours are normal.  The lungs are clear.  No pleural effusion.  The bony thorax is intact.  IMPRESSION: Normal chest x-ray.   Original Report Authenticated By: P. MARK  Pecolia Ades, M.D.       Fayrene Helper, PA-C 05/02/12 1525

## 2012-05-02 NOTE — ED Notes (Signed)
PT BMI IS 32.2

## 2012-05-02 NOTE — ED Provider Notes (Signed)
Medical screening examination/treatment/procedure(s) were conducted as a shared visit with non-physician practitioner(s) and myself.  I personally evaluated the patient during the encounter   Charles B. Sheldon, MD 05/02/12 1536 

## 2012-05-02 NOTE — ED Notes (Signed)
Pt is here with pain to center of chest and felt short of breath with tingling all over.  No pain now

## 2012-05-02 NOTE — ED Notes (Signed)
Urine sample collected if needed. 

## 2012-05-03 NOTE — ED Provider Notes (Signed)
Medical screening examination/treatment/procedure(s) were conducted as a shared visit with non-physician practitioner(s) and myself.  I personally evaluated the patient during the encounter   Suliman Termini B. Bernette Mayers, MD 05/03/12 1013

## 2012-05-21 ENCOUNTER — Other Ambulatory Visit: Payer: Self-pay | Admitting: Internal Medicine

## 2013-01-10 ENCOUNTER — Other Ambulatory Visit: Payer: Self-pay | Admitting: Internal Medicine

## 2013-07-08 ENCOUNTER — Other Ambulatory Visit: Payer: Self-pay | Admitting: Internal Medicine

## 2013-09-09 ENCOUNTER — Other Ambulatory Visit: Payer: Self-pay | Admitting: Internal Medicine

## 2013-10-08 IMAGING — CR DG CHEST 2V
2 series · 2 of 2 positions shown · non-contrast
Comparison: None

CLINICAL DATA: Chest pain and cough.

CHEST - 2 VIEW

[w chest pa]
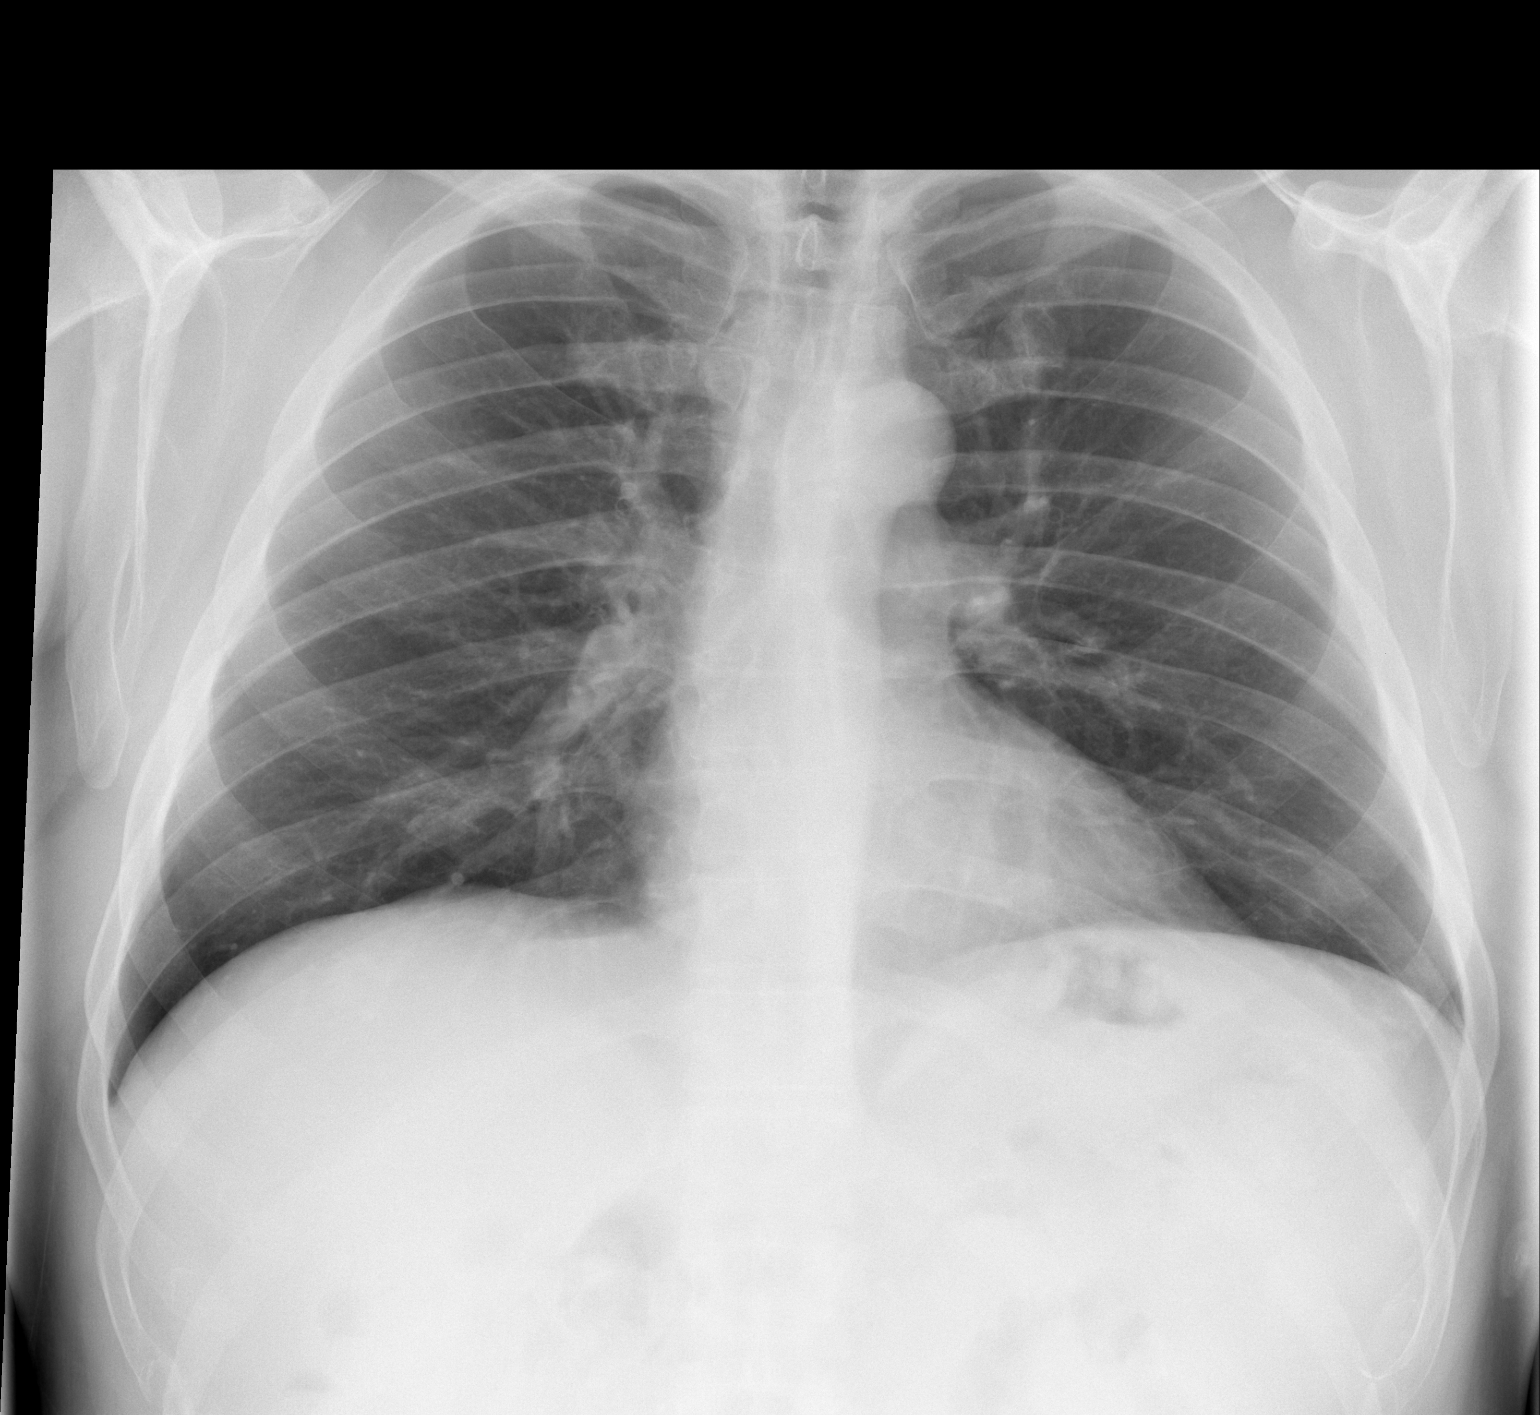

[w chest lat]
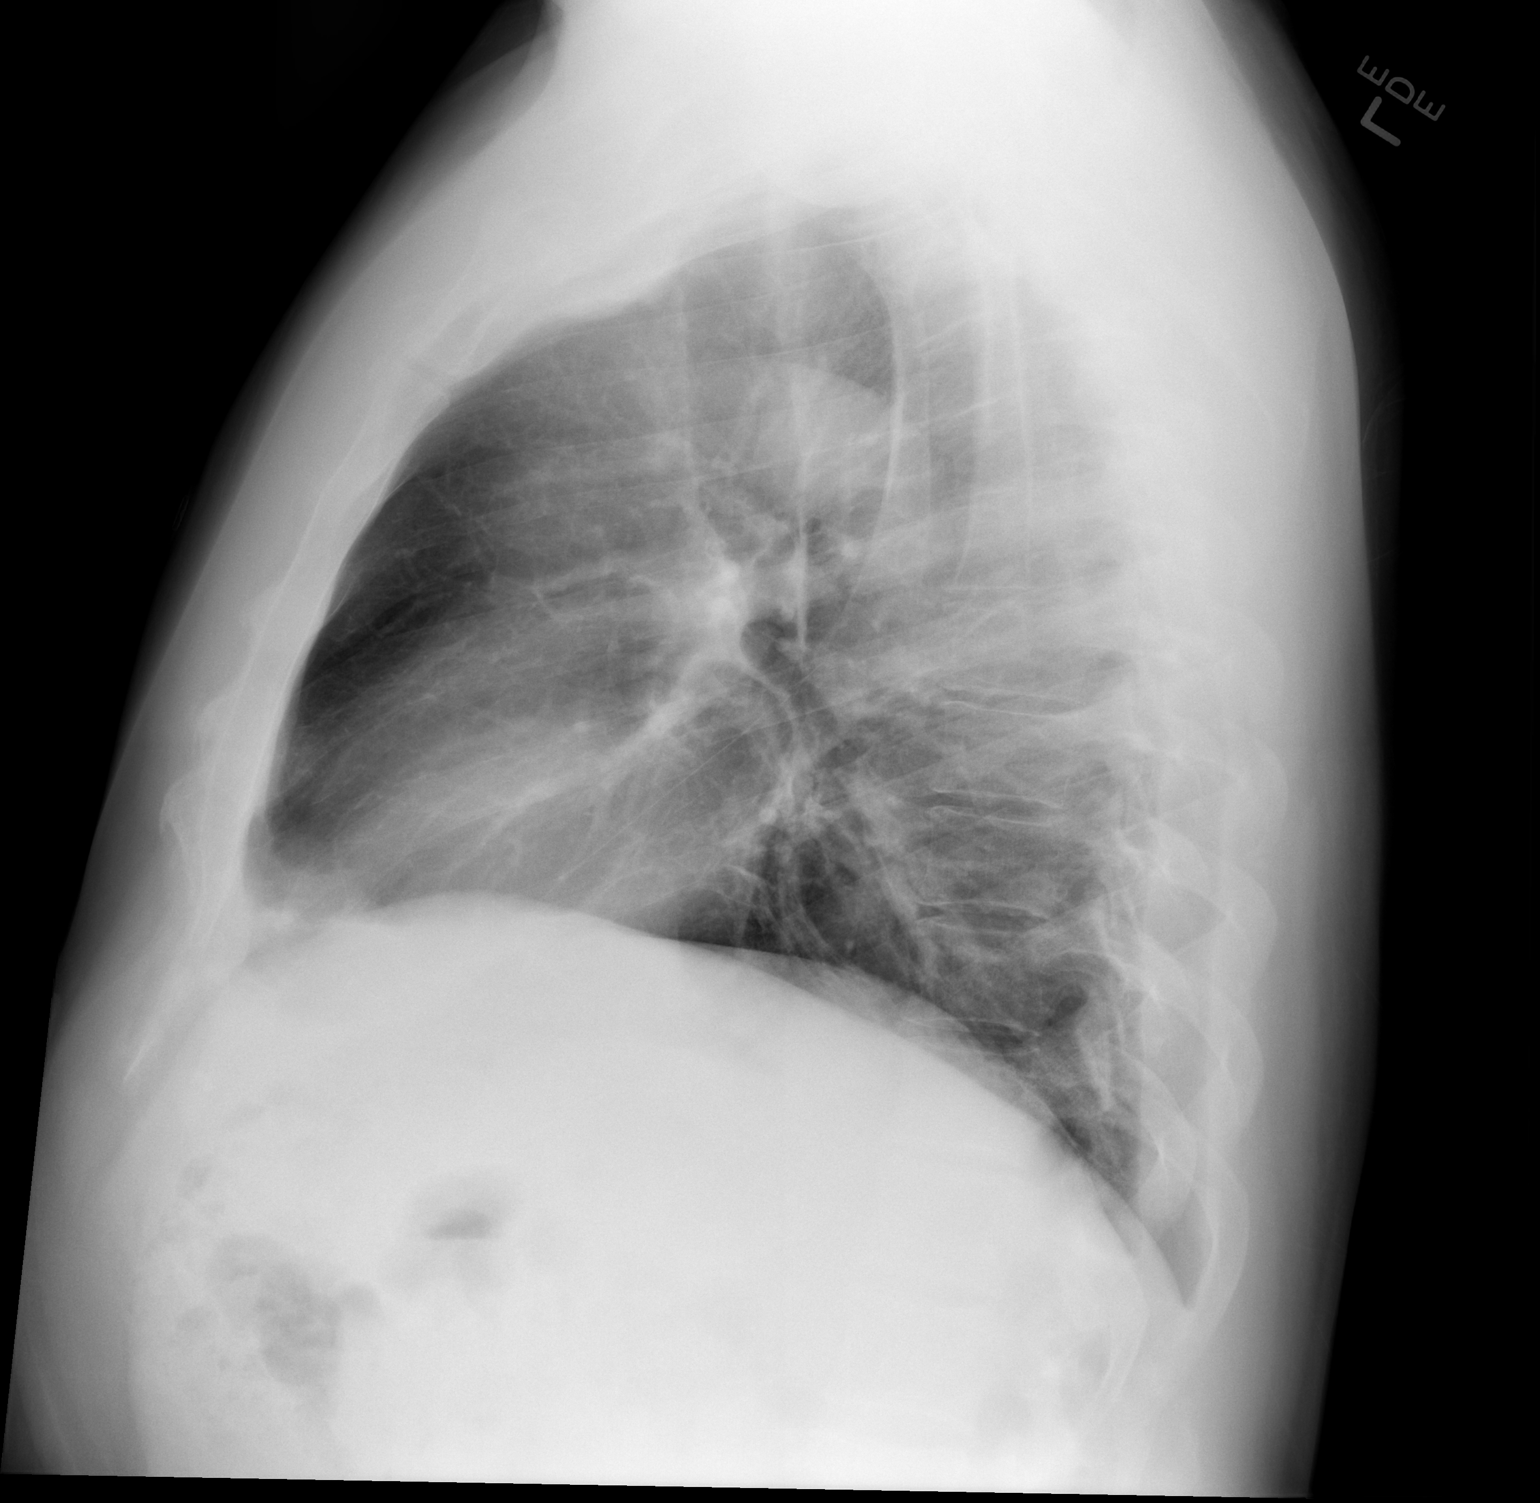

[2 of 2 positions shown; findings below may reference images not displayed]

FINDINGS: The cardiac silhouette, mediastinal and hilar contours
are normal.  The lungs are clear.  No pleural effusion.  The bony
thorax is intact.
IMPRESSION: Normal chest x-ray.

## 2013-10-08 IMAGING — CT CT HEART MORP W/ CTA COR W/ SCORE W/ CA W/CM &/OR W/O CM
2 of 6 series · 11 of 20 positions shown, 12 images · IV contrast (omnipaque)
Comparison: None

INDICATION: Chest pain, shortness of breath.

CT ANGIOGRAPHY OF THE HEART, CORONARY ARTERY, STRUCTURE, AND
MORPHOLOGY
CONTRAST: 80mL OMNIPAQUE IOHEXOL 350 MG/ML SOLN
TECHNIQUE: CT angiography of the coronary vessels was performed on
a 256 channel system using prospective ECG gating.  A scout and
noncontrast exam (for calcium scoring) were performed.  Circulation
time was measured using a test bolus.  Coronary CTA was performed
with sub mm slice collimation during portions of the cardiac cycle
after prior injection of iodinated contrast.  Imaging post
processing was performed on an independent workstation creating
multiplanar and 3-D images, and quantitative analysis of the heart
and coronary arteries.  Note that this exam targets the heart and
the chest was not imaged in its entirety.
PREMEDICATION:
Lopressor 200 mg, P.O.
Nitroglycerin 0.4 mcg, sublingual.

[Series 3: calc score thin · axial · 0.49mm/px · z∈[-187,-69]mm · 3 of 96 slices shown, 4 images]
[im 1/96  vessel]
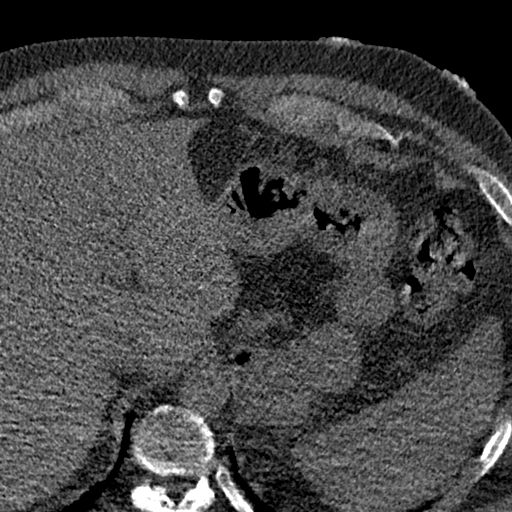
[im 1/96  lung]
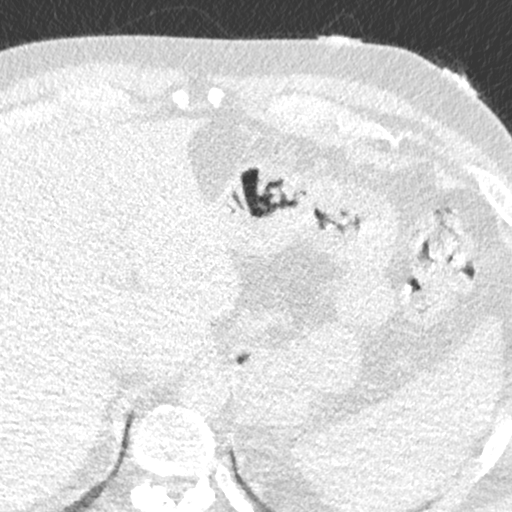
[im 48/96  vessel]
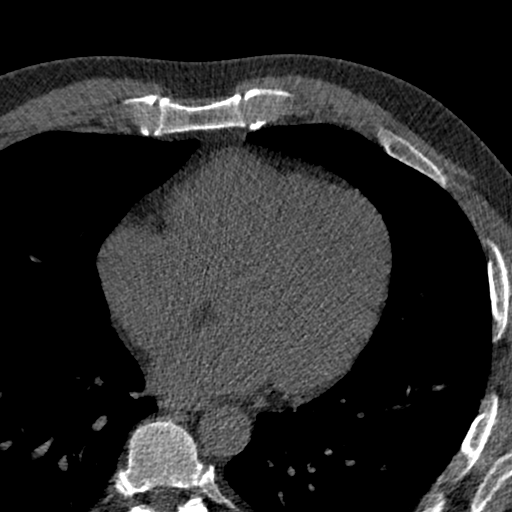
[im 96/96  vessel]
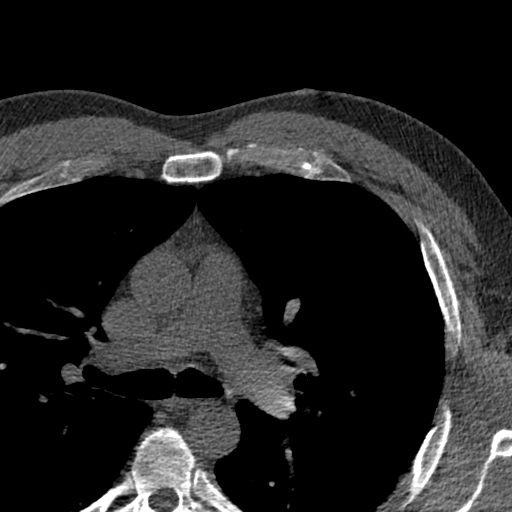

[Series 8: w/ edge cor., 78.0%a · axial · 0.49mm/px · z∈[-180,-81]mm · 8 of 276 slices shown]
[im 28/276  lung]
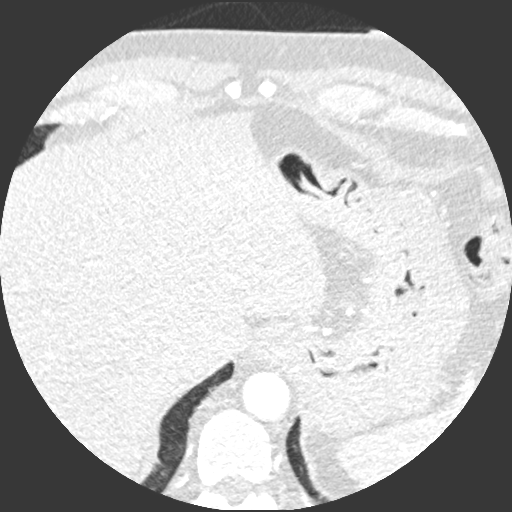
[im 56/276  lung]
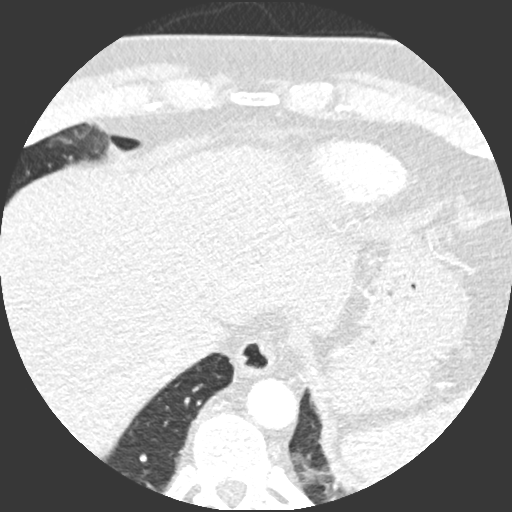
[im 83/276  lung]
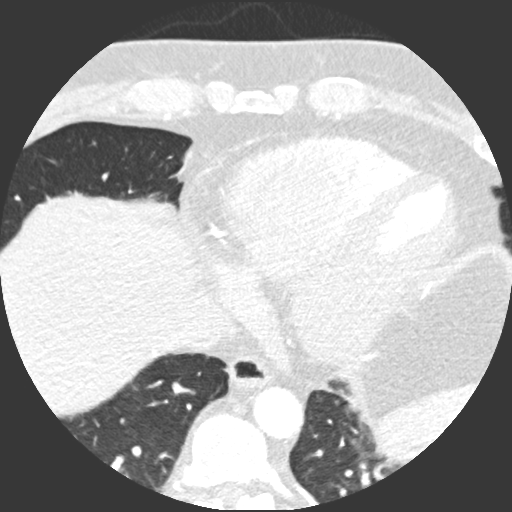
[im 111/276  lung]
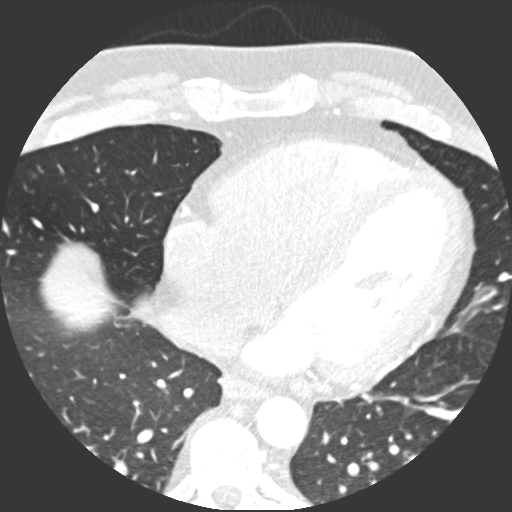
[im 166/276  lung]
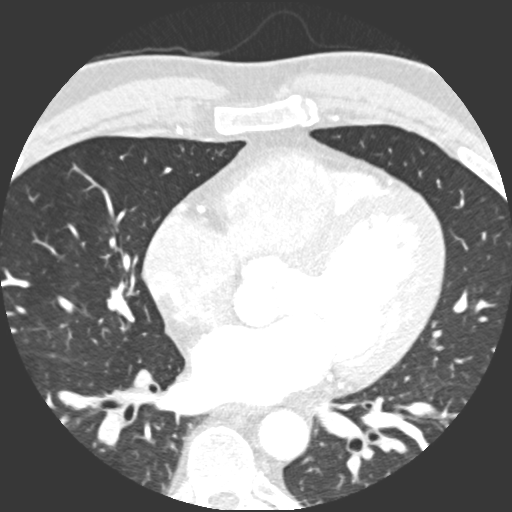
[im 193/276  lung]
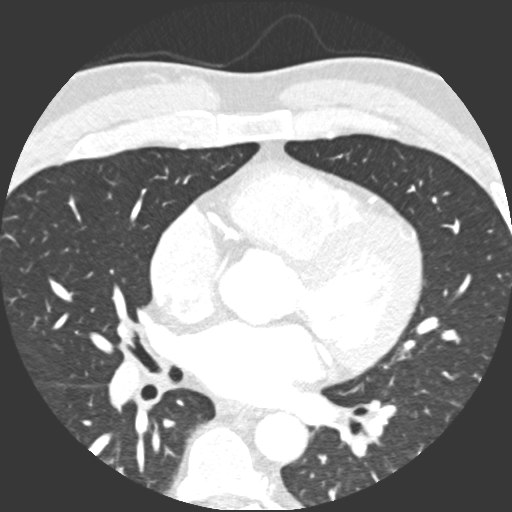
[im 221/276  lung]
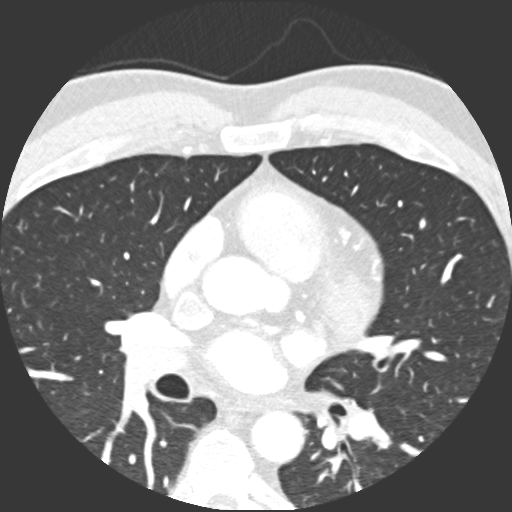
[im 248/276  lung]
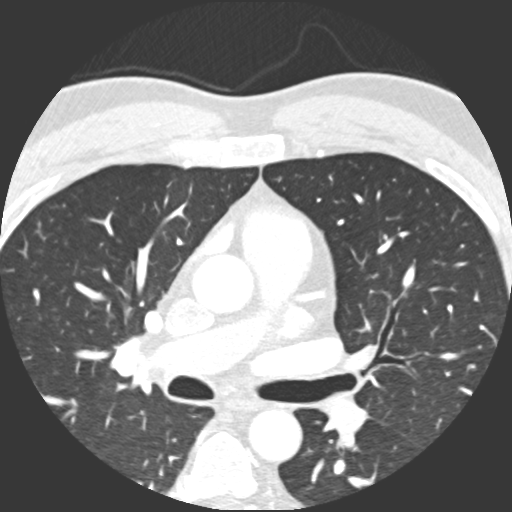

[11 of 20 positions shown; findings below may reference images not displayed]

FINDINGS: Technical quality:  Good.

Heart rate:  54

CORONARY ARTERIES:
Left main coronary artery:  Widely patent.  No intrinsic disease or
stenosis.
Left anterior descending:  Widely patent.  No intrinsic disease or
stenosis.
Left circumflex:  Widely patent.  No intrinsic disease or stenosis.
Right coronary artery:  Widely patent.  No intrinsic disease or
stenosis.
Posterior descending artery:  Widely patent.
Dominance:  Right

CORONARY CALCIUM:
Total Agatston Score:  0
[HOSPITAL] percentile:  0

CARDIAC MEASUREMENTS:
Interventricular septum (6 - 12 mm):  11 mm
LV posterior wall (6 - 12 mm):  9 mm
LV diameter in diastole (35 - 52 mm):  41 mm

AORTA AND PULMONARY MEASUREMENTS:
Aortic root (21 - 40 mm):
            26 mm  at the annulus
            35 mm  at the sinuses of Valsalva

            27 mm  at the sinotubular junction
Ascending aorta ( <  40 mm):  28 mm
Descending aorta ( <  40 mm):  24 mm
Main pulmonary artery:  ( <  30 mm):  24 mm

EXTRACARDIAC FINDINGS:
Minimal dependent and bibasilar atelectasis.  No pleural or
pericardial effusion.  No adenopathy in the visualized lower
mediastinum or hila.  No acute bony abnormality.  No visible
pulmonary embolus.
IMPRESSION: 1.  Negative for coronary artery disease.  The patient's total
coronary artery calcium score is 0, which is 0 percentile for
patient's matched age and gender.
2.  No significant extracardiac abnormality.
3.  Right coronary artery dominance.

Report was called to [place] in the [REDACTED] at [DATE] p.m. on 05/02/2012.

## 2013-10-16 ENCOUNTER — Other Ambulatory Visit: Payer: Self-pay | Admitting: Internal Medicine

## 2014-01-20 ENCOUNTER — Other Ambulatory Visit: Payer: Self-pay | Admitting: Internal Medicine

## 2014-02-15 ENCOUNTER — Encounter: Payer: Self-pay | Admitting: Internal Medicine

## 2014-02-22 ENCOUNTER — Encounter: Payer: Self-pay | Admitting: Internal Medicine

## 2014-04-11 ENCOUNTER — Ambulatory Visit (AMBULATORY_SURGERY_CENTER): Payer: BC Managed Care – PPO

## 2014-04-11 VITALS — Ht 70.0 in | Wt 228.0 lb

## 2014-04-11 DIAGNOSIS — Z860100 Personal history of colon polyps, unspecified: Secondary | ICD-10-CM

## 2014-04-11 MED ORDER — MOVIPREP 100 G PO SOLR
1.0000 | Freq: Once | ORAL | Status: DC
Start: 2014-04-11 — End: 2014-04-25

## 2014-04-11 NOTE — Progress Notes (Signed)
No allergies to eggs or soy No past problems with anesthesia No home oxygen No diet/weight loss meds  Has email  Emmi instructions given for colonoscopy 

## 2014-04-25 ENCOUNTER — Ambulatory Visit (AMBULATORY_SURGERY_CENTER): Payer: BC Managed Care – PPO | Admitting: Internal Medicine

## 2014-04-25 ENCOUNTER — Encounter: Payer: Self-pay | Admitting: Internal Medicine

## 2014-04-25 VITALS — BP 124/89 | HR 62 | Temp 97.8°F | Resp 14 | Ht 70.0 in | Wt 228.0 lb

## 2014-04-25 DIAGNOSIS — D122 Benign neoplasm of ascending colon: Secondary | ICD-10-CM

## 2014-04-25 DIAGNOSIS — Z800 Family history of malignant neoplasm of digestive organs: Secondary | ICD-10-CM

## 2014-04-25 DIAGNOSIS — D123 Benign neoplasm of transverse colon: Secondary | ICD-10-CM

## 2014-04-25 DIAGNOSIS — Z860100 Personal history of colon polyps, unspecified: Secondary | ICD-10-CM

## 2014-04-25 MED ORDER — SODIUM CHLORIDE 0.9 % IV SOLN: 500.0000 mL | INTRAVENOUS | Status: DC

## 2014-04-25 NOTE — Patient Instructions (Signed)
YOU HAD AN ENDOSCOPIC PROCEDURE TODAY AT THE [facility]: Refer to the procedure report that was given to you for any specific questions about what was found during the examination.  If the procedure report does not answer your questions, please call your gastroenterologist to clarify.  If you requested that your care partner not be given the details of your procedure findings, then the procedure report has been included in a sealed envelope for you to review at your convenience later.  YOU SHOULD EXPECT: Some feelings of bloating in the abdomen. Passage of more gas than usual.  Walking can help get rid of the air that was put into your GI tract during the procedure and reduce the bloating. If you had a lower endoscopy (such as a colonoscopy or flexible sigmoidoscopy) you may notice spotting of blood in your stool or on the toilet paper. If you underwent a bowel prep for your procedure, then you may not have a normal bowel movement for a few days.  DIET: Your first meal following the procedure should be a light meal and then it is ok to progress to your normal diet.  A half-sandwich or bowl of soup is an example of a good first meal.  Heavy or fried foods are harder to digest and may make you feel nauseous or bloated.  Likewise meals heavy in dairy and vegetables can cause extra gas to form and this can also increase the bloating.  Drink plenty of fluids but you should avoid alcoholic beverages for 24 hours.  ACTIVITY: Your care partner should take you home directly after the procedure.  You should plan to take it easy, moving slowly for the rest of the day.  You can resume normal activity the day after the procedure however you should NOT DRIVE or use heavy machinery for 24 hours (because of the sedation medicines used during the test).    SYMPTOMS TO REPORT IMMEDIATELY: A gastroenterologist can be reached at any hour.  During normal business hours, 8:30 AM to 5:00 PM Monday through Friday,  call [phone].  After hours and on weekends, please call the GI answering service at [phone] who will take a message and have the physician on call contact you.   Following lower endoscopy (colonoscopy or flexible sigmoidoscopy):  Excessive amounts of blood in the stool  Significant tenderness or worsening of abdominal pains  Swelling of the abdomen that is new, acute  Fever of 100F or higher    FOLLOW UP: If any biopsies were taken you will be contacted by phone or by letter within the next 1-3 weeks.  Call your gastroenterologist if you have not heard about the biopsies in 3 weeks.  Our staff will call the home number listed on your records the next business day following your procedure to check on you and address any questions or concerns that you may have at that time regarding the information given to you following your procedure. This is a courtesy call and so if there is no answer at the home number and we have not heard from you through the emergency physician on call, we will assume that you have returned to your regular daily activities without incident.  SIGNATURES/CONFIDENTIALITY: You and/or your care partner have signed paperwork which will be entered into your electronic medical record.  These signatures attest to the fact that that the information above on your After Visit Summary has been reviewed and is understood.  Full responsibility of the confidentiality   of this discharge information lies with you and/or your care-partner.   Polyp, diverticulosis and high fiber information given.  Next colonoscopy 3 years-2018.

## 2014-04-25 NOTE — Op Note (Signed)
[facility]  [address]. [place], [address]   COLONOSCOPY PROCEDURE REPORT  PATIENT: Kevin Hoffman, Kevin Hoffman  MR#: [id number] BIRTHDATE: July 10, 1961 , 53  yrs. old GENDER: male ENDOSCOPIST: Eustace Quail, MD REFERRED SM:[id number] Program Recall PROCEDURE DATE:  04/25/2014 PROCEDURE:   Colonoscopy with snare polypectomy x3 First Screening Colonoscopy - Avg.  risk and is 50 yrs.  old or older - No.  Prior Negative Screening - Now for repeat screening. N/A  History of Adenoma - Now for follow-up colonoscopy & has been > or = to 3 yrs.  Yes hx of adenoma.  Has been 3 or more years since last colonoscopy.  Polyps Removed Today? Yes. ASA CLASS:   Class II INDICATIONS:patient's immediate family history of colon cancer (mother, 77s) and surveillance colonoscopy based on a history of adenomatous colonic polyp(s). Index exam September 2012 (7 polyps).  MEDICATIONS: Monitored anesthesia care and Propofol 350 mg IV  DESCRIPTION OF PROCEDURE:   After the risks benefits and alternatives of the procedure were thoroughly explained, informed consent was obtained.  The digital rectal exam revealed no abnormalities of the rectum.   The [id number]  endoscope was introduced through the anus and advanced to the cecum, which was identified by both the appendix and ileocecal valve. No adverse events experienced.   The quality of the prep was excellent, using MoviPrep  The instrument was then slowly withdrawn as the colon was fully examined.  COLON FINDINGS: Three polyps ranging between 3-12mm in size were found in the transverse and ascending colon.  A polypectomy was performed with a cold snare.  The resection was complete, the polyp tissue was completely retrieved and sent to histology.   There was moderate diverticulosis in the left colon.   The examination was otherwise normal.  Retroflexed views revealed internal hemorrhoids. The time to cecum=3 min 0 sec.  Withdrawal  time=13 min 33 sec.  The scope was withdrawn and the procedure completed. COMPLICATIONS: There were no immediate complications.  ENDOSCOPIC IMPRESSION: 1.   Three polyps were found in the transverse colon and ascending colon; polypectomy was performed with a cold snare 2.   Moderate diverticulosis was noted in the left colon 3.   The examination was otherwise normal  RECOMMENDATIONS: 1. Repeat Colonoscopy in 3 years.  eSigned:  Eustace Quail, MD 04/25/2014 11:52 AM   cc: Janalyn Rouse, MD    ;. The patient

## 2014-04-25 NOTE — Progress Notes (Signed)
A/ox3, pleased with MAC, report to RN 

## 2014-04-25 NOTE — Progress Notes (Signed)
Called to room to assist during endoscopic procedure.  Patient ID and intended procedure confirmed with present staff. Received instructions for my participation in the procedure from the performing physician.  

## 2014-04-26 ENCOUNTER — Telehealth: Payer: Self-pay | Admitting: *Deleted

## 2014-04-26 NOTE — Telephone Encounter (Signed)
Message left

## 2014-05-02 ENCOUNTER — Other Ambulatory Visit: Payer: Self-pay | Admitting: Internal Medicine

## 2014-05-02 ENCOUNTER — Encounter: Payer: Self-pay | Admitting: Internal Medicine

## 2014-07-25 ENCOUNTER — Telehealth: Payer: Self-pay

## 2014-07-25 MED ORDER — OMEPRAZOLE 40 MG PO CPDR: 40.0000 mg | DELAYED_RELEASE_CAPSULE | Freq: Every day | ORAL | Status: DC

## 2014-07-25 NOTE — Telephone Encounter (Signed)
Refilled Omeprazole 

## 2015-01-20 ENCOUNTER — Other Ambulatory Visit: Payer: Self-pay | Admitting: Internal Medicine

## 2015-04-20 ENCOUNTER — Other Ambulatory Visit: Payer: Self-pay | Admitting: Internal Medicine

## 2015-04-24 ENCOUNTER — Telehealth: Payer: Self-pay | Admitting: Internal Medicine

## 2015-04-24 ENCOUNTER — Other Ambulatory Visit: Payer: Self-pay | Admitting: Internal Medicine

## 2015-04-26 MED ORDER — OMEPRAZOLE 40 MG PO CPDR: 40.0000 mg | DELAYED_RELEASE_CAPSULE | Freq: Every day | ORAL | Status: DC

## 2015-04-26 NOTE — Telephone Encounter (Signed)
Refilled Omeprazole 

## 2015-06-19 ENCOUNTER — Ambulatory Visit: Payer: Self-pay | Admitting: Internal Medicine

## 2015-07-01 ENCOUNTER — Other Ambulatory Visit (HOSPITAL_COMMUNITY): Payer: Self-pay | Admitting: Internal Medicine

## 2015-07-01 DIAGNOSIS — R1011 Right upper quadrant pain: Secondary | ICD-10-CM

## 2015-07-05 ENCOUNTER — Ambulatory Visit (HOSPITAL_COMMUNITY)
Admission: RE | Admit: 2015-07-05 | Discharge: 2015-07-05 | Disposition: A | Discharge status: Home / Self Care | Payer: BLUE CROSS/BLUE SHIELD | Source: Ambulatory Visit | Attending: Internal Medicine | Admitting: Internal Medicine

## 2015-07-05 DIAGNOSIS — R1011 Right upper quadrant pain: Secondary | ICD-10-CM

## 2015-07-21 ENCOUNTER — Other Ambulatory Visit: Payer: Self-pay | Admitting: Internal Medicine

## 2016-01-22 ENCOUNTER — Ambulatory Visit: Payer: Self-pay

## 2016-01-22 ENCOUNTER — Ambulatory Visit (INDEPENDENT_AMBULATORY_CARE_PROVIDER_SITE_OTHER): Payer: BLUE CROSS/BLUE SHIELD

## 2016-01-22 ENCOUNTER — Encounter: Payer: Self-pay | Admitting: Podiatry

## 2016-01-22 ENCOUNTER — Ambulatory Visit (INDEPENDENT_AMBULATORY_CARE_PROVIDER_SITE_OTHER): Payer: BLUE CROSS/BLUE SHIELD | Admitting: Podiatry

## 2016-01-22 VITALS — BP 129/79 | HR 52 | Resp 16 | Ht 69.0 in | Wt 215.0 lb

## 2016-01-22 DIAGNOSIS — M21619 Bunion of unspecified foot: Secondary | ICD-10-CM | POA: Diagnosis not present

## 2016-01-22 DIAGNOSIS — M2041 Other hammer toe(s) (acquired), right foot: Secondary | ICD-10-CM | POA: Diagnosis not present

## 2016-01-22 NOTE — Patient Instructions (Signed)
Bunionectomy A bunionectomy is a surgical procedure to remove a bunion. A bunion is a visible bump of bone on the inside of your foot where your big toe meets the rest of your foot. A bunion can develop when pressure turns this bone (first metatarsal) toward the other toes. Shoes that are too tight are the most common cause of bunions. Bunions can also be caused by diseases, such as arthritis and polio. You may need a bunionectomy if your bunion is very large and painful or it affects your ability to walk. LET [facility] CARE PROVIDER KNOW ABOUT:  Any allergies you have.  All medicines you are taking, including vitamins, herbs, eye drops, creams, and over-the-counter medicines.  Previous problems you or members of your family have had with the use of anesthetics.  Any blood disorders you have.  Previous surgeries you have had.  Medical conditions you have. RISKS AND COMPLICATIONS  Generally, this is a safe procedure. However, problems may occur, including:  Infection.  Pain.  Nerve damage.  Bleeding or blood clots.  Reactions to medicines.  Numbness, stiffness, or arthritis in your toe.  Foot problems that continue even after the procedure. BEFORE THE PROCEDURE  Ask your health care provider about:  Changing or stopping your regular medicines. This is especially important if you are taking diabetes medicines or blood thinners.  Taking medicines such as aspirin and ibuprofen. These medicines can thin your blood. Do not take these medicines before your procedure if your health care provider instructs you not to.  Do not drink alcohol before the procedure as directed by your health care provider.  Do not use tobacco products, including cigarettes, chewing tobacco, or electronic cigarettes, before the procedure as directed by your health care provider. If you need help quitting, ask your health care provider.  Ask your health care provider what kind of medicine you will be  given during your procedure. A bunionectomy may be done using one of these:  A medicine that numbs the area (local anesthetic).  A medicine that makes you go to sleep (general anesthetic). If you will be given general anesthetic, do not eat or drink anything after midnight on the night before the procedure or as directed by your health care provider. PROCEDURE  An IV tube may be inserted into a vein.  You will be given local anesthetic or general anesthetic.  The surgeon will make a cut (incision) over the enlarged area at the first joint of the big toe. The surgeon will remove the bunion.  You may have more than one incision if any of the bones in your big toe need to be moved. A bone itself may need to be cut.  Sometimes the tissues around the big toe may also need to be cut then tightened or loosened to reposition the toe.  Screws or other hardware may be used to keep your foot in thecorrect position.  The incision will be closed with stitches (sutures) and covered with adhesive strips or another type of bandage (dressing). AFTER THE PROCEDURE  You may spend some time in a recovery area.  Your blood pressure, heart rate, breathing rate, and blood oxygen level will be monitored often until the medicines you were given have worn off.   This information is not intended to replace advice given to you by your health care provider. Make sure you discuss any questions you have with your health care provider.   Document Released: 06/19/2005 Document Revised: 03/27/2015 Document Reviewed: 02/21/2014  Chartered certified accountant Patient Education [employer].

## 2016-01-22 NOTE — Progress Notes (Signed)
   Subjective:    Patient ID: Kevin Hoffman, male    DOB: Nov 23, 1960, 55 y.o.   MRN: [id number]  HPI Chief Complaint  Patient presents with  . Foot Pain    Right foot; bunion; hammertoe (2nd toe); pt stated, "Pain is on and off; had hammertoe since 2010 and bunion for 10 years"      Review of Systems  Psychiatric/Behavioral: The patient is nervous/anxious.   All other systems reviewed and are negative.      Objective:   Physical Exam        Assessment & Plan:

## 2016-01-22 NOTE — Progress Notes (Signed)
Subjective:     Patient ID: Kevin Hoffman, male   DOB: Jul 27, 1960, 55 y.o.   MRN: [id number]  HPI patient states he has a significant bunion on his right foot and the second toe on his right foot that has lifted in the air and become rigid and it happened in 2010 when [place] was at the beach and occurred   Review of Systems  All other systems reviewed and are negative.      Objective:   Physical Exam  Constitutional: He is oriented to person, place, and time.  Cardiovascular: Intact distal pulses.   Musculoskeletal: Normal range of motion.  Neurological: He is oriented to person, place, and time.  Skin: Skin is warm.  Nursing note and vitals reviewed.  neurovascular status found to be intact with muscle strength adequate range of motion within normal limits with patient noted to have a rigid contracture second digit right at the metatarsophalangeal joint with mild discomfort within the joint and discomfort on top of the toe due to the rigid nature. Has large structural bunion deformity right with redness and pain occurring and mild restriction of motion first MPJ right over left with left foot showing a larger dorsal spur formation. Patient's found to have good digital perfusion and is well oriented 3     Assessment:     Structural bunion deformity right along with digital hammertoe deformity with probable flexor plate tear which occurred a number of years ago    Plan:     H&P and all conditions reviewed and we discussed treatment options. Patient wants surgery but needs to hold off and I discussed a aggressive Austin-type osteotomy and digital fusion and the fact will probably not be able to get complete correction. Patient wants surgery and is scheduled to have this done at his convenience and will be seen by me prior to review  Port indicates elevation of the 1 to intermetatarsal angle of approximate 17 with a rigid elevation of the second digit right foot

## 2017-03-04 ENCOUNTER — Ambulatory Visit (HOSPITAL_COMMUNITY)
Admission: RE | Admit: 2017-03-04 | Discharge: 2017-03-04 | Disposition: A | Discharge status: Home / Self Care | Payer: BLUE CROSS/BLUE SHIELD | Source: Ambulatory Visit | Attending: Vascular Surgery | Admitting: Vascular Surgery

## 2017-03-04 ENCOUNTER — Other Ambulatory Visit (HOSPITAL_COMMUNITY): Payer: Self-pay | Admitting: Internal Medicine

## 2017-03-04 DIAGNOSIS — R202 Paresthesia of skin: Secondary | ICD-10-CM | POA: Diagnosis not present

## 2017-03-04 DIAGNOSIS — I10 Essential (primary) hypertension: Secondary | ICD-10-CM | POA: Diagnosis present

## 2017-03-04 LAB — VAS US CAROTID
LCCADSYS: -86 cm/s
LCCAPSYS: 135 cm/s
LEFT ECA DIAS: -11 cm/s
LEFT VERTEBRAL DIAS: 18 cm/s
Left CCA dist dias: -28 cm/s
Left CCA prox dias: 25 cm/s
Left ICA dist dias: -30 cm/s
Left ICA dist sys: -64 cm/s
Left ICA prox dias: -24 cm/s
Left ICA prox sys: -56 cm/s
RCCADSYS: -54 cm/s
RIGHT CCA MID DIAS: 25 cm/s
RIGHT ECA DIAS: -9 cm/s
RIGHT VERTEBRAL DIAS: 12 cm/s
Right CCA prox dias: 18 cm/s
Right CCA prox sys: 120 cm/s

## 2017-03-05 ENCOUNTER — Other Ambulatory Visit: Payer: Self-pay | Admitting: Internal Medicine

## 2017-03-05 ENCOUNTER — Ambulatory Visit (INDEPENDENT_AMBULATORY_CARE_PROVIDER_SITE_OTHER): Payer: BLUE CROSS/BLUE SHIELD

## 2017-03-05 DIAGNOSIS — R202 Paresthesia of skin: Secondary | ICD-10-CM

## 2017-03-05 DIAGNOSIS — R0789 Other chest pain: Secondary | ICD-10-CM

## 2017-03-05 LAB — EXERCISE TOLERANCE TEST
CHL CUP MPHR: 167 {beats}/min
CHL CUP RESTING HR STRESS: 61 {beats}/min
CSEPHR: 88 %
Estimated workload: 13.7 METS
Exercise duration (min): 12 min
Exercise duration (sec): 0 s
Peak HR: 148 {beats}/min
RPE: 16

## 2017-03-08 ENCOUNTER — Telehealth: Payer: Self-pay | Admitting: Internal Medicine

## 2017-03-08 ENCOUNTER — Telehealth: Payer: Self-pay | Admitting: Cardiovascular Disease

## 2017-03-08 NOTE — Telephone Encounter (Signed)
Received records from [facility] for appointment on 03/09/17 with Dr Fletcher Anon.  Records put with Dr Tyrell Antonio schedule for 03/09/17. lp

## 2017-03-08 NOTE — Telephone Encounter (Signed)
Close Encounter 

## 2017-03-09 ENCOUNTER — Ambulatory Visit (INDEPENDENT_AMBULATORY_CARE_PROVIDER_SITE_OTHER): Payer: BLUE CROSS/BLUE SHIELD | Admitting: Cardiovascular Disease

## 2017-03-09 VITALS — BP 106/82 | HR 65 | Ht 70.8 in | Wt 224.0 lb

## 2017-03-09 DIAGNOSIS — I209 Angina pectoris, unspecified: Secondary | ICD-10-CM

## 2017-03-09 DIAGNOSIS — Z01812 Encounter for preprocedural laboratory examination: Secondary | ICD-10-CM | POA: Diagnosis not present

## 2017-03-09 DIAGNOSIS — E785 Hyperlipidemia, unspecified: Secondary | ICD-10-CM | POA: Diagnosis not present

## 2017-03-09 DIAGNOSIS — I10 Essential (primary) hypertension: Secondary | ICD-10-CM | POA: Diagnosis not present

## 2017-03-09 DIAGNOSIS — Z79899 Other long term (current) drug therapy: Secondary | ICD-10-CM

## 2017-03-09 LAB — CBC
Hematocrit: 44.6 % (ref 37.5–51.0)
Hemoglobin: 16.1 g/dL (ref 13.0–17.7)
MCH: 31.9 pg (ref 26.6–33.0)
MCHC: 36.1 g/dL — ABNORMAL HIGH (ref 31.5–35.7)
MCV: 89 fL (ref 79–97)
Platelets: 249 10*3/uL (ref 150–379)
RBC: 5.04 x10E6/uL (ref 4.14–5.80)
RDW: 13 % (ref 12.3–15.4)
WBC: 4.6 10*3/uL (ref 3.4–10.8)

## 2017-03-09 LAB — COMPREHENSIVE METABOLIC PANEL
A/G RATIO: 2.2 (ref 1.2–2.2)
ALBUMIN: 4.4 g/dL (ref 3.5–5.5)
ALT: 16 IU/L (ref 0–44)
AST: 15 IU/L (ref 0–40)
Alkaline Phosphatase: 39 IU/L (ref 39–117)
BUN/Creatinine Ratio: 17 (ref 9–20)
BUN: 14 mg/dL (ref 6–24)
Bilirubin Total: 0.6 mg/dL (ref 0.0–1.2)
CALCIUM: 9.3 mg/dL (ref 8.7–10.2)
CHLORIDE: 101 mmol/L (ref 96–106)
CO2: 25 mmol/L (ref 20–29)
Creatinine, Ser: 0.81 mg/dL (ref 0.76–1.27)
GFR calc non Af Amer: 101 mL/min/{1.73_m2} (ref >59)
GFR, EST AFRICAN AMERICAN: 117 mL/min/{1.73_m2} (ref >59)
GLOBULIN, TOTAL: 2 g/dL (ref 1.5–4.5)
Glucose: 90 mg/dL (ref 65–99)
POTASSIUM: 5.3 mmol/L — AB (ref 3.5–5.2)
Sodium: 140 mmol/L (ref 134–144)
TOTAL PROTEIN: 6.4 g/dL (ref 6.0–8.5)

## 2017-03-09 LAB — PROTIME-INR
INR: 1 (ref 0.8–1.2)
Prothrombin Time: 10.6 s (ref 9.1–12.0)

## 2017-03-09 LAB — APTT: APTT: 26 s (ref 24–33)

## 2017-03-09 NOTE — Progress Notes (Signed)
  Cardiology Office Note   Date:  03/09/2017   ID:  Gene J Berrong, DOB 04/19/1961, MRN [id number]  PCP:  Shaw, William, MD  Cardiologist:   Cali Cuartas, MD   No chief complaint on file.     History of Present Illness: Kevin Hoffman is a 55 y.o. male who was referred by Dr. Shaw for evaluation of exertional chest pain. The patient has no prior cardiac history but has known history of GERD, hypertension, borderline Hyperlipidemia, mild obesity and colon polyps. He has no family history of coronary artery disease. There is family history of cancer. Over the last few months, he developed episodes of substernal chest tightness work mainly with over exertion. It does not happen with regular activities or yard work. However, it happens consistently with exercise such as walking at 4 miles per hour. The pain is described as tightness feeling with occasional radiation to his neck and jaw. He underwent a treadmill stress test and was able to exercise for 12 minutes. However, he had substernal chest tightness with exercise that persisted for about 20 minutes after he stopped with 1 mm of ST depression in V3 and V4 mainly. Chest pain is usually associated with shortness of breath but no other symptoms. He also had neck discomfort and was evaluated with carotid Doppler which showed mild nonobstructive disease bilaterally.  Interestingly, he had atypical chest pain in 2013 and went to urgent care with negative workup. He underwent coronary CTA which showed no evidence of coronary artery disease with calcium score of 0.    Past Medical History:  Diagnosis Date  . Arthritis   . Contact lens/glasses fitting   . Disc degeneration, lumbar   . Family history of colon cancer   . GERD (gastroesophageal reflux disease)   . Hernia   . Hiatal hernia     Past Surgical History:  Procedure Laterality Date  . CYSTECTOMY     fatty cyst removed from scalp  . EPIGASTRIC HERNIA REPAIR   07/08/2011   Procedure: HERNIA REPAIR EPIGASTRIC ADULT;  Surgeon: Brian David Layton, DO;  Location: [facility];  Service: General;  Laterality: N/A;  with mesh  . OTHER SURGICAL HISTORY     left 5th finger crushed injury surgery in ER   . TONSILECTOMY, ADENOIDECTOMY, BILATERAL MYRINGOTOMY AND TUBES  1978     Current Outpatient Prescriptions  Medication Sig Dispense Refill  . ibuprofen (ADVIL,MOTRIN) 200 MG tablet Take 400-600 mg by mouth every 6 (six) hours as needed. PAIN     . lisinopril (PRINIVIL,ZESTRIL) 20 MG tablet Take 20 mg by mouth daily.    . LORazepam (ATIVAN) 0.5 MG tablet TAKE 1/2 TO 1 TABLET EVERY 4 TO 6 HOURS AS NEEDED FOR ANXIETY  1  . Multiple Vitamin (MULTIVITAMIN) tablet Take 1 tablet by mouth daily. [employer] 50+ Mens    . omeprazole (PRILOSEC) 40 MG capsule TAKE ONE CAPSULE BY MOUTH EVERY DAY 90 capsule 0  . metoprolol tartrate (LOPRESSOR) 25 MG tablet Take 12.5 mg by mouth as needed. Take 0.5-1 tab (25mg tab) as needed if BP >140/90 or stressful events such as public speaking     No current facility-administered medications for this visit.     Allergies:   Patient has no known allergies.    Social History:  The patient  reports that he has never smoked. He has never used smokeless tobacco. He reports that he drinks about 6.0 oz of alcohol per week . He reports that he does   not use drugs.   Family History:  The patient's family history includes Alcohol abuse in his unknown relative; Breast cancer in his maternal grandmother; Colon cancer in his maternal grandmother; Colon cancer (age of onset: 67) in his mother; Coronary artery disease in his paternal grandfather; Diabetes in his maternal grandfather; Heart disease in his unknown relative; Hypertension in his mother; Melanoma in his father.    ROS:  Please see the history of present illness.   Otherwise, review of systems are positive for none.   All other systems are reviewed and negative.    PHYSICAL EXAM: VS:  BP  106/82 (BP Location: Left Arm)   Pulse 65   Ht 5' 10.8" (1.798 m)   Wt 224 lb (101.6 kg)   SpO2 97%   BMI 31.42 kg/m  , BMI Body mass index is 31.42 kg/m. GEN: Well nourished, well developed, in no acute distress  HEENT: normal  Neck: no JVD, carotid bruits, or masses Cardiac: RRR; no murmurs, rubs, or gallops,no edema  Respiratory:  clear to auscultation bilaterally, normal work of breathing GI: soft, nontender, nondistended, + BS MS: no deformity or atrophy  Skin: warm and dry, no rash Neuro:  Strength and sensation are intact Psych: euthymic mood, full affect   EKG:  EKG is ordered today. The ekg ordered today demonstrates normal sinus rhythm with nonspecific changes   Recent Labs: No results found for requested labs within last 8760 hours.    Lipid Panel No results found for: CHOL, TRIG, HDL, CHOLHDL, VLDL, LDLCALC, LDLDIRECT    Wt Readings from Last 3 Encounters:  03/09/17 224 lb (101.6 kg)  01/22/16 215 lb (97.5 kg)  04/25/14 228 lb (103.4 kg)       PAD Screen 03/09/2017  Previous PAD dx? No  Previous surgical procedure? No  Pain with walking? Yes  Subsides with rest? Yes  Feet/toe relief with dangling? No  Painful, non-healing ulcers? No  Extremities discolored? No      ASSESSMENT AND PLAN:  1.  Chest pain consistent with class II angina with abnormal moderate risk treadmill stress test: Given the patient's symptoms and abnormal treadmill stress test, I recommend proceeding with cardiac catheterization and possible coronary intervention. I discussed the procedure in details as well as risk and benefits. I did discuss with him the option of proceeding with a different form of stress testing including nuclear stress test or CTA of the coronary arteries. However, given his symptoms and abnormal findings on treadmill stress test, I think it makes more sense to proceed directly with cardiac catheterization. Certainly, some of his symptoms might be due to  physical deconditioning given that he gained 24-25 pounds over the last 5 years. However, that his a diagnosis of exclusion.  2. Essential hypertension: Blood pressure is controlled on lisinopril.  3. Borderline hyperlipidemia: The cardiac catheterization shows evidence of coronary artery disease, he will require treatment with a statin.    Disposition:   FU with me in 1 month  Signed,  Wolfgang Finigan, MD  03/09/2017 9:15 AM    [facility] 

## 2017-03-09 NOTE — Patient Instructions (Signed)
Medication Instructions:   Continue all current medications  If you need a refill on your cardiac medications before your next appointment, please call your pharmacy.  Labwork:  Pre Op Labs HERE IN OUR OFFICE AT [facility]  Testing/Procedures:   [place] [address] [place] [address] Dept: [phone] Loc: [facility]  03/09/2017  You are scheduled for a Cardiac Catheterization on Wednesday, August 29 with Dr. [name].  1. Please arrive at the [facility] (Main Entrance A) at [facility]: [address] [place], [place] [address] at 10:30 AM (two hours before your procedure to ensure your preparation). Free valet parking service is available.   Special note: Every effort is made to have your procedure done on time. Please understand that emergencies sometimes delay scheduled procedures.  2. Diet: Do not eat or drink anything after midnight prior to your procedure except sips of water to take medications.  3. Labs: You will need to have blood drawn on Tuesday, August 21 at [place], [place]  Open: [place] (Lunch 12:30 - 1:30)   Phone: [phone]. You do not need to be fasting.  4. Medication instructions in preparation for your procedure:    Current Outpatient Prescriptions (Cardiovascular):  .  lisinopril (PRINIVIL,ZESTRIL) 20 MG tablet, Take 20 mg by mouth daily. .  metoprolol tartrate (LOPRESSOR) 25 MG tablet, Take 12.5 mg by mouth as needed. Take 0.5-1 tab (25mg  tab) as needed if BP >140/90 or stressful events such as public speaking   Current Outpatient Prescriptions (Analgesics):  .  ibuprofen (ADVIL,MOTRIN) 200 MG tablet, Take 400-600 mg by mouth every 6 (six) hours as needed. PAIN    Current Outpatient Prescriptions (Other):  Marland Kitchen  LORazepam (ATIVAN) 0.5 MG tablet, TAKE 1/2 TO 1 TABLET EVERY 4 TO 6 HOURS  AS NEEDED FOR ANXIETY .  Multiple Vitamin (MULTIVITAMIN) tablet, Take 1 tablet by mouth daily. [place] 50+ Mens .  omeprazole (PRILOSEC) 40 MG capsule, TAKE ONE CAPSULE BY MOUTH EVERY DAY *For reference purposes while preparing patient instructions.   Delete this med list prior to printing instructions for patient.*   On the morning of your procedure, take your morning medicines NOT listed above.  You may use sips of water.  5. Plan for one night stay--bring personal belongings. 6. Bring a current list of your medications and current insurance cards. 7. You MUST have a responsible person to drive you home. 8. Someone MUST be with you the first 24 hours after you arrive home or your discharge will be delayed. 9. Please wear clothes that are easy to get on and off and wear slip-on shoes.  Thank you for allowing [place] to care for you!   -- [facility]   Follow-Up: Your physician wants you to follow-up in:  After Cath    Thank you for choosing [facility] at [facility]!!

## 2017-03-11 NOTE — Addendum Note (Signed)
Addended by: Alvina Filbert B on: 03/11/2017 09:37 AM   Modules accepted: Orders

## 2017-03-15 ENCOUNTER — Telehealth: Payer: Self-pay

## 2017-03-15 NOTE — Telephone Encounter (Signed)
Spoke with wife Lattie Haw per [employer].  Patient contacted pre-catheterization at [facility] scheduled for:  03/17/2017 @ 1230 Verified arrival time and place:  [facility] @ 1030 Confirmed AM meds to be taken pre-cath with sip of water: Take ASA Confirmed patient has responsible person to drive home post procedure and observe patient for 24 hours:  Yes  Addl concerns:  None noted

## 2017-03-17 ENCOUNTER — Encounter (HOSPITAL_COMMUNITY): Payer: Self-pay | Admitting: Cardiovascular Disease

## 2017-03-17 ENCOUNTER — Ambulatory Visit (HOSPITAL_COMMUNITY)
Admission: RE | Admit: 2017-03-17 | Discharge: 2017-03-17 | Disposition: A | Discharge status: Home / Self Care | Payer: BLUE CROSS/BLUE SHIELD | Source: Ambulatory Visit | Attending: Cardiovascular Disease | Admitting: Cardiovascular Disease

## 2017-03-17 ENCOUNTER — Ambulatory Visit (HOSPITAL_COMMUNITY)
Admission: RE | Disposition: A | Discharge status: Home / Self Care | Payer: Self-pay | Source: Ambulatory Visit | Attending: Cardiovascular Disease

## 2017-03-17 DIAGNOSIS — Z800 Family history of malignant neoplasm of digestive organs: Secondary | ICD-10-CM | POA: Diagnosis not present

## 2017-03-17 DIAGNOSIS — E785 Hyperlipidemia, unspecified: Secondary | ICD-10-CM | POA: Diagnosis not present

## 2017-03-17 DIAGNOSIS — Z86010 Personal history of colonic polyps: Secondary | ICD-10-CM | POA: Diagnosis not present

## 2017-03-17 DIAGNOSIS — K219 Gastro-esophageal reflux disease without esophagitis: Secondary | ICD-10-CM | POA: Insufficient documentation

## 2017-03-17 DIAGNOSIS — Z79899 Other long term (current) drug therapy: Secondary | ICD-10-CM | POA: Diagnosis not present

## 2017-03-17 DIAGNOSIS — M1990 Unspecified osteoarthritis, unspecified site: Secondary | ICD-10-CM | POA: Diagnosis not present

## 2017-03-17 DIAGNOSIS — K449 Diaphragmatic hernia without obstruction or gangrene: Secondary | ICD-10-CM | POA: Insufficient documentation

## 2017-03-17 DIAGNOSIS — I10 Essential (primary) hypertension: Secondary | ICD-10-CM | POA: Diagnosis not present

## 2017-03-17 DIAGNOSIS — M5136 Other intervertebral disc degeneration, lumbar region: Secondary | ICD-10-CM | POA: Insufficient documentation

## 2017-03-17 DIAGNOSIS — R0789 Other chest pain: Secondary | ICD-10-CM | POA: Diagnosis present

## 2017-03-17 DIAGNOSIS — E669 Obesity, unspecified: Secondary | ICD-10-CM | POA: Insufficient documentation

## 2017-03-17 DIAGNOSIS — I209 Angina pectoris, unspecified: Secondary | ICD-10-CM | POA: Diagnosis not present

## 2017-03-17 DIAGNOSIS — Z6833 Body mass index (BMI) 33.0-33.9, adult: Secondary | ICD-10-CM | POA: Insufficient documentation

## 2017-03-17 HISTORY — PX: LEFT HEART CATH AND CORONARY ANGIOGRAPHY: CATH118249

## 2017-03-17 SURGERY — LEFT HEART CATH AND CORONARY ANGIOGRAPHY
Anesthesia: LOCAL

## 2017-03-17 MED ORDER — MIDAZOLAM HCL 2 MG/2ML IJ SOLN
INTRAMUSCULAR | Status: AC
  Filled 2017-03-17: qty 2

## 2017-03-17 MED ORDER — HEPARIN (PORCINE) IN NACL 2-0.9 UNIT/ML-% IJ SOLN
INTRAMUSCULAR | Status: AC | PRN
  Administered 2017-03-17: 1000 mL

## 2017-03-17 MED ORDER — SODIUM CHLORIDE 0.9 % IV SOLN: 250.0000 mL | INTRAVENOUS | Status: DC | PRN

## 2017-03-17 MED ORDER — VERAPAMIL HCL 2.5 MG/ML IV SOLN
INTRAVENOUS | Status: AC
Start: 2017-03-17 — End: 2017-03-17
  Filled 2017-03-17: qty 2

## 2017-03-17 MED ORDER — ASPIRIN 81 MG PO CHEW: 81.0000 mg | CHEWABLE_TABLET | ORAL | Status: DC

## 2017-03-17 MED ORDER — HEPARIN (PORCINE) IN NACL 2-0.9 UNIT/ML-% IJ SOLN
INTRAMUSCULAR | Status: AC
  Filled 2017-03-17: qty 1000

## 2017-03-17 MED ORDER — SODIUM CHLORIDE 0.9 % IV SOLN: INTRAVENOUS | Status: DC

## 2017-03-17 MED ORDER — FENTANYL CITRATE (PF) 100 MCG/2ML IJ SOLN
INTRAMUSCULAR | Status: DC | PRN
  Administered 2017-03-17: 50 ug via INTRAVENOUS

## 2017-03-17 MED ORDER — HEPARIN SODIUM (PORCINE) 1000 UNIT/ML IJ SOLN
INTRAMUSCULAR | Status: DC | PRN
  Administered 2017-03-17: 5000 [IU] via INTRAVENOUS

## 2017-03-17 MED ORDER — MIDAZOLAM HCL 2 MG/2ML IJ SOLN
INTRAMUSCULAR | Status: DC | PRN
  Administered 2017-03-17: 1 mg via INTRAVENOUS

## 2017-03-17 MED ORDER — VERAPAMIL HCL 2.5 MG/ML IV SOLN
INTRAVENOUS | Status: DC | PRN
  Administered 2017-03-17: 13:00:00 via INTRA_ARTERIAL

## 2017-03-17 MED ORDER — IOPAMIDOL (ISOVUE-370) INJECTION 76%
INTRAVENOUS | Status: AC
  Filled 2017-03-17: qty 100

## 2017-03-17 MED ORDER — SODIUM CHLORIDE 0.9% FLUSH: 3.0000 mL | Freq: Two times a day (BID) | INTRAVENOUS | Status: DC

## 2017-03-17 MED ORDER — LIDOCAINE HCL 2 % IJ SOLN
INTRAMUSCULAR | Status: AC
  Filled 2017-03-17: qty 10

## 2017-03-17 MED ORDER — SODIUM CHLORIDE 0.9 % IV SOLN
INTRAVENOUS | Status: DC
  Administered 2017-03-17: 12:00:00 via INTRAVENOUS

## 2017-03-17 MED ORDER — HEPARIN SODIUM (PORCINE) 1000 UNIT/ML IJ SOLN
INTRAMUSCULAR | Status: AC
  Filled 2017-03-17: qty 1

## 2017-03-17 MED ORDER — LIDOCAINE HCL (PF) 1 % IJ SOLN
INTRAMUSCULAR | Status: DC | PRN
  Administered 2017-03-17: 2 mL

## 2017-03-17 MED ORDER — SODIUM CHLORIDE 0.9% FLUSH: 3.0000 mL | INTRAVENOUS | Status: DC | PRN

## 2017-03-17 MED ORDER — IOPAMIDOL (ISOVUE-370) INJECTION 76%
INTRAVENOUS | Status: DC | PRN
  Administered 2017-03-17: 90 mL via INTRAVENOUS

## 2017-03-17 MED ORDER — FENTANYL CITRATE (PF) 100 MCG/2ML IJ SOLN
INTRAMUSCULAR | Status: AC
  Filled 2017-03-17: qty 2

## 2017-03-17 SURGICAL SUPPLY — 14 items
CATH INFINITI 5 FR JL3.5 (CATHETERS) ×2 IMPLANT
CATH INFINITI 5FR ANG PIGTAIL (CATHETERS) ×2 IMPLANT
CATH OPTITORQUE JACKY 4.0 5F (CATHETERS) ×2 IMPLANT
COVER PRB 48X5XTLSCP FOLD TPE (BAG) ×1 IMPLANT
COVER PROBE 5X48 (BAG) ×1
DEVICE RAD COMP TR BAND LRG (VASCULAR PRODUCTS) ×2 IMPLANT
GLIDESHEATH SLEND SS 6F .021 (SHEATH) ×2 IMPLANT
GUIDEWIRE INQWIRE 1.5J.035X260 (WIRE) ×1 IMPLANT
INQWIRE 1.5J .035X260CM (WIRE) ×2
KIT HEART LEFT (KITS) ×2 IMPLANT
PACK CARDIAC CATHETERIZATION (CUSTOM PROCEDURE TRAY) ×2 IMPLANT
SYR MEDRAD MARK V 150ML (SYRINGE) ×2 IMPLANT
TRANSDUCER W/STOPCOCK (MISCELLANEOUS) ×2 IMPLANT
TUBING CIL FLEX 10 FLL-RA (TUBING) ×2 IMPLANT

## 2017-03-17 NOTE — Discharge Instructions (Signed)

## 2017-03-17 NOTE — H&P (View-Only) (Signed)
Cardiology Office Note   Date:  03/09/2017   ID:  Kevin Hoffman August 20, 1960, MRN [id number]  PCP:  Kevin Redwood, MD  Cardiologist:   Kevin Sacramento, MD   No chief complaint on file.     History of Present Illness: Kevin Hoffman is a 56 y.o. male who was referred by Dr. Brigitte Hoffman for evaluation of exertional chest pain. The patient has no prior cardiac history but has known history of GERD, hypertension, borderline Hyperlipidemia, mild obesity and colon polyps. He has no family history of coronary artery disease. There is family history of cancer. Over the last few months, he developed episodes of substernal chest tightness work mainly with over exertion. It does not happen with regular activities or yard work. However, it happens consistently with exercise such as walking at 4 miles per hour. The pain is described as tightness feeling with occasional radiation to his neck and jaw. He underwent a treadmill stress test and was able to exercise for 12 minutes. However, he had substernal chest tightness with exercise that persisted for about 20 minutes after he stopped with 1 mm of ST depression in V3 and V4 mainly. Chest pain is usually associated with shortness of breath but no other symptoms. He also had neck discomfort and was evaluated with carotid Doppler which showed mild nonobstructive disease bilaterally.  Interestingly, he had atypical chest pain in 2013 and went to urgent care with negative workup. He underwent coronary CTA which showed no evidence of coronary artery disease with calcium score of 0.    Past Medical History:  Diagnosis Date  . Arthritis   . Contact lens/glasses fitting   . Disc degeneration, lumbar   . Family history of colon cancer   . GERD (gastroesophageal reflux disease)   . Hernia   . Hiatal hernia     Past Surgical History:  Procedure Laterality Date  . CYSTECTOMY     fatty cyst removed from scalp  . EPIGASTRIC HERNIA REPAIR   07/08/2011   Procedure: HERNIA REPAIR EPIGASTRIC ADULT;  Surgeon: Kevin Keens, DO;  Location: [facility];  Service: General;  Laterality: N/A;  with mesh  . OTHER SURGICAL HISTORY     left 5th finger crushed injury surgery in ER   . TONSILECTOMY, ADENOIDECTOMY, BILATERAL MYRINGOTOMY AND TUBES  1978     Current Outpatient Prescriptions  Medication Sig Dispense Refill  . ibuprofen (ADVIL,MOTRIN) 200 MG tablet Take 400-600 mg by mouth every 6 (six) hours as needed. PAIN     . lisinopril (PRINIVIL,ZESTRIL) 20 MG tablet Take 20 mg by mouth daily.    Marland Kitchen LORazepam (ATIVAN) 0.5 MG tablet TAKE 1/2 TO 1 TABLET EVERY 4 TO 6 HOURS AS NEEDED FOR ANXIETY  1  . Multiple Vitamin (MULTIVITAMIN) tablet Take 1 tablet by mouth daily. [place] 50+ Mens    . omeprazole (PRILOSEC) 40 MG capsule TAKE ONE CAPSULE BY MOUTH EVERY DAY 90 capsule 0  . metoprolol tartrate (LOPRESSOR) 25 MG tablet Take 12.5 mg by mouth as needed. Take 0.5-1 tab (25mg  tab) as needed if BP >140/90 or stressful events such as public speaking     No current facility-administered medications for this visit.     Allergies:   Patient has no known allergies.    Social History:  The patient  reports that he has never smoked. He has never used smokeless tobacco. He reports that he drinks about 6.0 oz of alcohol per week . He reports that he does  not use drugs.   Family History:  The patient's family history includes Alcohol abuse in his unknown relative; Breast cancer in his maternal grandmother; Colon cancer in his maternal grandmother; Colon cancer (age of onset: 90) in his mother; Coronary artery disease in his paternal grandfather; Diabetes in his maternal grandfather; Heart disease in his unknown relative; Hypertension in his mother; Melanoma in his father.    ROS:  Please see the history of present illness.   Otherwise, review of systems are positive for none.   All other systems are reviewed and negative.    PHYSICAL EXAM: VS:  BP  106/82 (BP Location: Left Arm)   Hoffman 65   Ht 5' 10.8" (1.798 m)   Wt 224 lb (101.6 kg)   SpO2 97%   BMI 31.42 kg/m  , BMI Body mass index is 31.42 kg/m. GEN: Well nourished, well developed, in no acute distress  HEENT: normal  Neck: no JVD, carotid bruits, or masses Cardiac: RRR; no murmurs, rubs, or gallops,no edema  Respiratory:  clear to auscultation bilaterally, normal work of breathing GI: soft, nontender, nondistended, + BS MS: no deformity or atrophy  Skin: warm and dry, no rash Neuro:  Strength and sensation are intact Psych: euthymic mood, full affect   EKG:  EKG is ordered today. The ekg ordered today demonstrates normal sinus rhythm with nonspecific changes   Recent Labs: No results found for requested labs within last 8760 hours.    Lipid Panel No results found for: CHOL, TRIG, HDL, CHOLHDL, VLDL, LDLCALC, LDLDIRECT    Wt Readings from Last 3 Encounters:  03/09/17 224 lb (101.6 kg)  01/22/16 215 lb (97.5 kg)  04/25/14 228 lb (103.4 kg)       PAD Screen 03/09/2017  Previous PAD dx? No  Previous surgical procedure? No  Pain with walking? Yes  Subsides with rest? Yes  Feet/toe relief with dangling? No  Painful, non-healing ulcers? No  Extremities discolored? No      ASSESSMENT AND PLAN:  1.  Chest pain consistent with class II angina with abnormal moderate risk treadmill stress test: Given the patient's symptoms and abnormal treadmill stress test, I recommend proceeding with cardiac catheterization and possible coronary intervention. I discussed the procedure in details as well as risk and benefits. I did discuss with him the option of proceeding with a different form of stress testing including nuclear stress test or CTA of the coronary arteries. However, given his symptoms and abnormal findings on treadmill stress test, I think it makes more sense to proceed directly with cardiac catheterization. Certainly, some of his symptoms might be due to  physical deconditioning given that he gained 24-25 pounds over the last 5 years. However, that his a diagnosis of exclusion.  2. Essential hypertension: Blood pressure is controlled on lisinopril.  3. Borderline hyperlipidemia: The cardiac catheterization shows evidence of coronary artery disease, he will require treatment with a statin.    Disposition:   FU with me in 1 month  Signed,  Kevin Sacramento, MD  03/09/2017 9:15 AM    [place]

## 2017-03-17 NOTE — Interval H&P Note (Signed)
Cath Lab Visit (complete for each Cath Lab visit)  Clinical Evaluation Leading to the Procedure:   ACS: No.  Non-ACS:    Anginal Classification: CCS II  Anti-ischemic medical therapy: Minimal Therapy (1 class of medications)  Non-Invasive Test Results: Intermediate-risk stress test findings: cardiac mortality 1-3%/year  Prior CABG: No previous CABG      History and Physical Interval Note:  03/17/2017 12:54 PM  Kevin Hoffman  has presented today for surgery, with the diagnosis of cp, abnormal stress test  The various methods of treatment have been discussed with the patient and family. After consideration of risks, benefits and other options for treatment, the patient has consented to  Procedure(s): LEFT HEART CATH AND CORONARY ANGIOGRAPHY (N/A) as a surgical intervention .  The patient's history has been reviewed, patient examined, no change in status, stable for surgery.  I have reviewed the patient's chart and labs.  Questions were answered to the patient's satisfaction.     Kevin Hoffman

## 2017-03-18 MED FILL — Lidocaine HCl Local Preservative Free (PF) Inj 2%: INTRAMUSCULAR | Qty: 10 | Status: AC

## 2017-03-23 ENCOUNTER — Encounter: Payer: Self-pay | Admitting: Internal Medicine

## 2017-03-25 DIAGNOSIS — G43B1 Ophthalmoplegic migraine, intractable: Secondary | ICD-10-CM | POA: Diagnosis not present

## 2017-03-29 ENCOUNTER — Other Ambulatory Visit: Payer: Self-pay | Admitting: Internal Medicine

## 2017-03-29 DIAGNOSIS — H539 Unspecified visual disturbance: Secondary | ICD-10-CM

## 2017-03-29 DIAGNOSIS — R202 Paresthesia of skin: Secondary | ICD-10-CM

## 2017-03-29 DIAGNOSIS — R0789 Other chest pain: Secondary | ICD-10-CM | POA: Diagnosis not present

## 2017-03-29 DIAGNOSIS — R51 Headache: Principal | ICD-10-CM

## 2017-03-29 DIAGNOSIS — R519 Headache, unspecified: Secondary | ICD-10-CM

## 2017-03-30 ENCOUNTER — Ambulatory Visit
Admission: RE | Admit: 2017-03-30 | Discharge: 2017-03-30 | Disposition: A | Discharge status: Home / Self Care | Payer: BLUE CROSS/BLUE SHIELD | Source: Ambulatory Visit | Attending: Internal Medicine | Admitting: Internal Medicine

## 2017-03-30 DIAGNOSIS — H539 Unspecified visual disturbance: Secondary | ICD-10-CM

## 2017-03-30 DIAGNOSIS — R202 Paresthesia of skin: Secondary | ICD-10-CM

## 2017-03-30 DIAGNOSIS — R519 Headache, unspecified: Secondary | ICD-10-CM

## 2017-03-30 DIAGNOSIS — R51 Headache: Secondary | ICD-10-CM | POA: Diagnosis not present

## 2017-04-06 ENCOUNTER — Ambulatory Visit (INDEPENDENT_AMBULATORY_CARE_PROVIDER_SITE_OTHER): Payer: BLUE CROSS/BLUE SHIELD | Admitting: Cardiovascular Disease

## 2017-04-06 ENCOUNTER — Encounter: Payer: Self-pay | Admitting: Cardiovascular Disease

## 2017-04-06 VITALS — BP 110/70 | HR 70 | Ht 69.0 in | Wt 223.8 lb

## 2017-04-06 DIAGNOSIS — R0789 Other chest pain: Secondary | ICD-10-CM

## 2017-04-06 DIAGNOSIS — I10 Essential (primary) hypertension: Secondary | ICD-10-CM | POA: Diagnosis not present

## 2017-04-06 NOTE — Patient Instructions (Addendum)
Your physician recommends that you schedule a follow-up appointment in: AS NEEDED  

## 2017-04-06 NOTE — Progress Notes (Signed)
Cardiology Office Note   Date:  04/06/2017   ID:  Kevin Hoffman, DOB Dec 19, 1960, MRN [id number]  PCP:  Marton Redwood, MD  Cardiologist:   [name], MD   Chief Complaint  Patient presents with  . Follow-up    post cath      History of Present Illness: Kevin Hoffman is a 56 y.o. male who is here today for a follow-up visit regarding chest pain and recent cardiac catheterization. The patient has no prior cardiac history but has known history of GERD, hypertension, borderline Hyperlipidemia, mild obesity and colon polyps. He has no family history of coronary artery disease. There is family history of cancer. He was seen recently for exertional chest pain and abnormal treadmill stress test. He was able to exercise for 12 minutes. However, he had substernal chest tightness with exercise that persisted for about 20 minutes after he stopped with 1 mm of ST depression in V3 and V4 mainly. Interestingly, he had atypical chest pain in 2013 and went to urgent care with negative workup. He underwent coronary CTA which showed no evidence of coronary artery disease with calcium score of 0.  I proceeded with cardiac catheterization which showed normal coronary arteries, normal left ventricular systolic function and normal left ventricular end-diastolic pressure.  The evening of the procedure, he developed some blurred vision with associated headache. 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 He did undergo brain MRI which showed no acute abnormalities. He is feeling better overall.    Past Medical History:  Diagnosis Date  . Arthritis   . Contact lens/glasses fitting   . Disc degeneration, lumbar   . Family history of colon cancer   . GERD (gastroesophageal reflux disease)   . Hernia   . Hiatal hernia     Past Surgical History:  Procedure Laterality Date  . CYSTECTOMY     fatty  cyst removed from scalp  . EPIGASTRIC HERNIA REPAIR  07/08/2011   Procedure: HERNIA REPAIR EPIGASTRIC ADULT;  Surgeon: Judieth Keens, DO;  Location: [facility];  Service: General;  Laterality: N/A;  with mesh  . LEFT HEART CATH AND CORONARY ANGIOGRAPHY N/A 03/17/2017   Procedure: LEFT HEART CATH AND CORONARY ANGIOGRAPHY;  Surgeon: Wellington Hampshire, MD;  Location: [facility];  Service: Cardiovascular;  Laterality: N/A;  . OTHER SURGICAL HISTORY     left 5th finger crushed injury surgery in ER   . TONSILECTOMY, ADENOIDECTOMY, BILATERAL MYRINGOTOMY AND TUBES  1978     Current Outpatient Prescriptions  Medication Sig Dispense Refill  . ibuprofen (ADVIL,MOTRIN) 200 MG tablet Take 400-600 mg by mouth every 6 (six) hours as needed. PAIN     . lisinopril (PRINIVIL,ZESTRIL) 20 MG tablet Take 20 mg by mouth daily.    Marland Kitchen LORazepam (ATIVAN) 0.5 MG tablet TAKE 1/2 TO 1 TABLET EVERY 4 TO 6 HOURS AS NEEDED FOR ANXIETY  1  . Melatonin 5 MG CHEW Chew 5-10 mg by mouth at bedtime as needed (sleep).    . metoprolol tartrate (LOPRESSOR) 25 MG tablet Take 12.5 mg by mouth as needed. Take 0.5-1 tab (25mg  tab) as needed if BP >140/90 or stressful events such as public speaking    . omeprazole (PRILOSEC) 40 MG capsule TAKE ONE CAPSULE BY MOUTH EVERY DAY 90 capsule 0   No current facility-administered medications for this visit.     Allergies:  Patient has no known allergies.    Social History:  The patient  reports that he has never smoked. He has never used smokeless tobacco. He reports that he drinks about 6.0 oz of alcohol per week . He reports that he does not use drugs.   Family History:  The patient's family history includes Alcohol abuse in his unknown relative; Breast cancer in his maternal grandmother; Colon cancer in his maternal grandmother; Colon cancer (age of onset: 42) in his mother; Coronary artery disease in his paternal grandfather; Diabetes in his maternal grandfather; Heart disease  in his unknown relative; Hypertension in his mother; Melanoma in his father.    ROS:  Please see the history of present illness.   Otherwise, review of systems are positive for none.   All other systems are reviewed and negative.    PHYSICAL EXAM: VS:  BP 110/70   Pulse 70   Ht 5\' 9"  (1.753 m)   Wt 223 lb 12.8 oz (101.5 kg)   BMI 33.05 kg/m  , BMI Body mass index is 33.05 kg/m. GEN: Well nourished, well developed, in no acute distress  HEENT: normal  Neck: no JVD, carotid bruits, or masses Cardiac: RRR; no murmurs, rubs, or gallops,no edema  Respiratory:  clear to auscultation bilaterally, normal work of breathing GI: soft, nontender, nondistended, + BS MS: no deformity or atrophy  Skin: warm and dry, no rash Neuro:  Strength and sensation are intact Psych: euthymic mood, full affect Right radial pulse is normal with no hematoma.  EKG:  EKG is not ordered today.    Recent Labs: 03/09/2017: ALT 16; BUN 14; Creatinine, Ser 0.81; Hemoglobin 16.1; Platelets 249; Potassium 5.3; Sodium 140    Lipid Panel No results found for: CHOL, TRIG, HDL, CHOLHDL, VLDL, LDLCALC, LDLDIRECT    Wt Readings from Last 3 Encounters:  04/06/17 223 lb 12.8 oz (101.5 kg)  03/17/17 224 lb (101.6 kg)  03/09/17 224 lb (101.6 kg)       PAD Screen 03/09/2017  Previous PAD dx? No  Previous surgical procedure? No  Pain with walking? Yes  Subsides with rest? Yes  Feet/toe relief with dangling? No  Painful, non-healing ulcers? No  Extremities discolored? No      ASSESSMENT AND PLAN:  1.  Chest pain:  Cardiac catheterization showed normal coronary arteries and ejection fraction. It is possible some of his symptoms might be related to some degree of physical deconditioning. I advised him to start an exercise program.  2. Essential hypertension: Blood pressure is controlled on lisinopril.  3. Borderline hyperlipidemia:  We discussed the importance of healthy lifestyle changes including diet,  exercise and weight loss.   Disposition:   FU with me as needed.  Signed,  [name], MD  04/06/2017 5:49 PM    [place]

## 2017-05-01 DIAGNOSIS — Z23 Encounter for immunization: Secondary | ICD-10-CM | POA: Diagnosis not present

## 2017-05-21 ENCOUNTER — Ambulatory Visit (AMBULATORY_SURGERY_CENTER): Payer: Self-pay

## 2017-05-21 VITALS — Ht 69.0 in | Wt 226.4 lb

## 2017-05-21 DIAGNOSIS — Z860100 Personal history of colon polyps, unspecified: Secondary | ICD-10-CM

## 2017-05-21 MED ORDER — SUPREP BOWEL PREP KIT 17.5-3.13-1.6 GM/177ML PO SOLN: 1.0000 | Freq: Once | ORAL | 0 refills | Status: AC

## 2017-05-21 NOTE — Progress Notes (Signed)
No allergies to eggs or soy No past problems with anesthesia No home oxygen No diet meds  Declined emmi 

## 2017-05-26 ENCOUNTER — Encounter: Payer: Self-pay | Admitting: Internal Medicine

## 2017-05-26 DIAGNOSIS — R7301 Impaired fasting glucose: Secondary | ICD-10-CM | POA: Diagnosis not present

## 2017-05-26 DIAGNOSIS — I10 Essential (primary) hypertension: Secondary | ICD-10-CM | POA: Diagnosis not present

## 2017-05-26 DIAGNOSIS — Z125 Encounter for screening for malignant neoplasm of prostate: Secondary | ICD-10-CM | POA: Diagnosis not present

## 2017-06-02 DIAGNOSIS — Z1389 Encounter for screening for other disorder: Secondary | ICD-10-CM | POA: Diagnosis not present

## 2017-06-02 DIAGNOSIS — Z800 Family history of malignant neoplasm of digestive organs: Secondary | ICD-10-CM | POA: Diagnosis not present

## 2017-06-02 DIAGNOSIS — R7301 Impaired fasting glucose: Secondary | ICD-10-CM | POA: Diagnosis not present

## 2017-06-02 DIAGNOSIS — I10 Essential (primary) hypertension: Secondary | ICD-10-CM | POA: Diagnosis not present

## 2017-06-02 DIAGNOSIS — E668 Other obesity: Secondary | ICD-10-CM | POA: Diagnosis not present

## 2017-06-02 DIAGNOSIS — Z0000 Encounter for general adult medical examination without abnormal findings: Secondary | ICD-10-CM | POA: Diagnosis not present

## 2017-06-04 ENCOUNTER — Ambulatory Visit (AMBULATORY_SURGERY_CENTER): Payer: BLUE CROSS/BLUE SHIELD | Admitting: Internal Medicine

## 2017-06-04 ENCOUNTER — Encounter: Payer: Self-pay | Admitting: Internal Medicine

## 2017-06-04 ENCOUNTER — Other Ambulatory Visit: Payer: Self-pay

## 2017-06-04 VITALS — BP 121/79 | HR 56 | Temp 97.3°F | Resp 10 | Ht 69.0 in | Wt 226.0 lb

## 2017-06-04 DIAGNOSIS — Z86010 Personal history of colonic polyps: Secondary | ICD-10-CM

## 2017-06-04 DIAGNOSIS — D124 Benign neoplasm of descending colon: Secondary | ICD-10-CM

## 2017-06-04 DIAGNOSIS — D123 Benign neoplasm of transverse colon: Secondary | ICD-10-CM

## 2017-06-04 DIAGNOSIS — D120 Benign neoplasm of cecum: Secondary | ICD-10-CM | POA: Diagnosis not present

## 2017-06-04 MED ORDER — SODIUM CHLORIDE 0.9 % IV SOLN: 500.0000 mL | INTRAVENOUS | Status: DC

## 2017-06-04 NOTE — Progress Notes (Signed)
Report to PACU, RN, vss, BBS= Clear.  

## 2017-06-04 NOTE — Op Note (Signed)
[facility] Patient Name: Kevin Hoffman Procedure Date: 06/04/2017 10:44 AM MRN: [id number] Endoscopist: Docia Chuck. Henrene Pastor , MD Age: 56 Referring MD:  Date of Birth: April 14, 1961 Gender: Male Account #: [id number] Procedure:                Colonoscopy, with cold snare polypectomy x 3 Indications:              High risk colon cancer surveillance: Personal                            history of multiple (3 or more) adenomas. Reviews                            examinations 2012 and 2015. Mother with colon                            cancer in her 33s Medicines:                Monitored Anesthesia Care Procedure:                Pre-Anesthesia Assessment:                           - Prior to the procedure, a History and Physical                            was performed, and patient medications and                            allergies were reviewed. The patient's tolerance of                            previous anesthesia was also reviewed. The risks                            and benefits of the procedure and the sedation                            options and risks were discussed with the patient.                            All questions were answered, and informed consent                            was obtained. Prior Anticoagulants: The patient has                            taken no previous anticoagulant or antiplatelet                            agents. ASA Grade Assessment: II - A patient with                            mild systemic disease. After reviewing the risks  and benefits, the patient was deemed in                            satisfactory condition to undergo the procedure.                           After obtaining informed consent, the colonoscope                            was passed under direct vision. Throughout the                            procedure, the patient's blood pressure, pulse, and                            oxygen  saturations were monitored continuously. The                            Colonoscope was introduced through the anus and                            advanced to the the cecum, identified by                            appendiceal orifice and ileocecal valve. The                            ileocecal valve, appendiceal orifice, and rectum                            were photographed. The quality of the bowel                            preparation was excellent. The colonoscopy was                            performed without difficulty. The patient tolerated                            the procedure well. The bowel preparation used was                            SUPREP. Scope In: 10:58:32 AM Scope Out: 11:16:37 AM Scope Withdrawal Time: 0 hours 15 minutes 30 seconds  Total Procedure Duration: 0 hours 18 minutes 5 seconds  Findings:                 Three polyps were found in the descending colon,                            transverse colon and cecum. The polyps were 2 to 3                            mm in size. These polyps were removed with a cold  snare. Resection and retrieval were complete.                           Multiple diverticula were found in the left colon.                           The exam was otherwise without abnormality on                            direct and retroflexion views. Complications:            No immediate complications. Estimated blood loss:                            None. Estimated Blood Loss:     Estimated blood loss: none. Impression:               - Three 2 to 3 mm polyps in the descending colon,                            in the transverse colon and in the cecum.                           - Diverticulosis in the left colon.                           - The examination was otherwise normal on direct                            and retroflexion views.                           - No specimens collected. Recommendation:           -  Repeat colonoscopy in 3 years for surveillance.                           - Patient has a contact number available for                            emergencies. The signs and symptoms of potential                            delayed complications were discussed with the                            patient. Return to normal activities tomorrow.                            Written discharge instructions were provided to the                            patient.                           - Resume previous diet.                           -  Continue present medications.                           - Await pathology results. Docia Chuck. Henrene Pastor, MD 06/04/2017 11:23:37 AM This report has been signed electronically.

## 2017-06-04 NOTE — Patient Instructions (Signed)
YOU HAD AN ENDOSCOPIC PROCEDURE TODAY AT [facility]:   Refer to the procedure report that was given to you for any specific questions about what was found during the examination.  If the procedure report does not answer your questions, please call your gastroenterologist to clarify.  If you requested that your care partner not be given the details of your procedure findings, then the procedure report has been included in a sealed envelope for you to review at your convenience later.  YOU SHOULD EXPECT: Some feelings of bloating in the abdomen. Passage of more gas than usual.  Walking can help get rid of the air that was put into your GI tract during the procedure and reduce the bloating. If you had a lower endoscopy (such as a colonoscopy or flexible sigmoidoscopy) you may notice spotting of blood in your stool or on the toilet paper. If you underwent a bowel prep for your procedure, you may not have a normal bowel movement for a few days.  Please Note:  You might notice some irritation and congestion in your nose or some drainage.  This is from the oxygen used during your procedure.  There is no need for concern and it should clear up in a day or so.  SYMPTOMS TO REPORT IMMEDIATELY:   Following lower endoscopy (colonoscopy or flexible sigmoidoscopy):  Excessive amounts of blood in the stool  Significant tenderness or worsening of abdominal pains  Swelling of the abdomen that is new, acute  Fever of 100F or higher  For urgent or emergent issues, a gastroenterologist can be reached at any hour by calling [phone].   DIET:  We do recommend a small meal at first, but then you may proceed to your regular diet.  Drink plenty of fluids but you should avoid alcoholic beverages for 24 hours.  ACTIVITY:  You should plan to take it easy for the rest of today and you should NOT DRIVE or use heavy machinery until tomorrow (because of the sedation medicines used during the test).     FOLLOW UP: Our staff will call the number listed on your records the next business day following your procedure to check on you and address any questions or concerns that you may have regarding the information given to you following your procedure. If we do not reach you, we will leave a message.  However, if you are feeling well and you are not experiencing any problems, there is no need to return our call.  We will assume that you have returned to your regular daily activities without incident.  If any biopsies were taken you will be contacted by phone or by letter within the next 1-3 weeks.  Please call us at [phone] if you have not heard about the biopsies in 3 weeks.    SIGNATURES/CONFIDENTIALITY: You and/or your care partner have signed paperwork which will be entered into your electronic medical record.  These signatures attest to the fact that that the information above on your After Visit Summary has been reviewed and is understood.  Full responsibility of the confidentiality of this discharge information lies with you and/or your care-partner.  Polyp, diverticulosis, high fiber diet information given.

## 2017-06-04 NOTE — Progress Notes (Signed)
Pt's states no medical or surgical changes since previsit or office visit. 

## 2017-06-04 NOTE — Progress Notes (Signed)
Called to room to assist during endoscopic procedure.  Patient ID and intended procedure confirmed with present staff. Received instructions for my participation in the procedure from the performing physician.  

## 2017-06-07 ENCOUNTER — Telehealth: Payer: Self-pay | Admitting: *Deleted

## 2017-06-07 NOTE — Telephone Encounter (Signed)
No answer, left message to call if questions or concerns. 

## 2017-06-07 NOTE — Telephone Encounter (Signed)
Second call, no answer.  Left message to call if questions or concerns.

## 2017-06-09 ENCOUNTER — Encounter: Payer: Self-pay | Admitting: Internal Medicine

## 2017-06-18 ENCOUNTER — Telehealth: Payer: Self-pay | Admitting: Internal Medicine

## 2017-06-18 NOTE — Telephone Encounter (Signed)
Path letter reviewed with pts wife over the phone and additional letter mailed out to pt.

## 2017-07-21 DIAGNOSIS — D2262 Melanocytic nevi of left upper limb, including shoulder: Secondary | ICD-10-CM | POA: Diagnosis not present

## 2017-07-21 DIAGNOSIS — D2261 Melanocytic nevi of right upper limb, including shoulder: Secondary | ICD-10-CM | POA: Diagnosis not present

## 2017-07-21 DIAGNOSIS — D225 Melanocytic nevi of trunk: Secondary | ICD-10-CM | POA: Diagnosis not present

## 2017-07-21 DIAGNOSIS — D2271 Melanocytic nevi of right lower limb, including hip: Secondary | ICD-10-CM | POA: Diagnosis not present

## 2017-09-07 DIAGNOSIS — J329 Chronic sinusitis, unspecified: Secondary | ICD-10-CM | POA: Diagnosis not present

## 2017-09-07 DIAGNOSIS — R05 Cough: Secondary | ICD-10-CM | POA: Diagnosis not present

## 2017-09-07 DIAGNOSIS — Z6831 Body mass index (BMI) 31.0-31.9, adult: Secondary | ICD-10-CM | POA: Diagnosis not present

## 2018-05-17 DIAGNOSIS — I10 Essential (primary) hypertension: Secondary | ICD-10-CM | POA: Diagnosis not present

## 2018-05-17 DIAGNOSIS — R05 Cough: Secondary | ICD-10-CM | POA: Diagnosis not present

## 2018-05-17 DIAGNOSIS — J189 Pneumonia, unspecified organism: Secondary | ICD-10-CM | POA: Diagnosis not present

## 2018-05-17 DIAGNOSIS — Z6831 Body mass index (BMI) 31.0-31.9, adult: Secondary | ICD-10-CM | POA: Diagnosis not present

## 2018-05-30 DIAGNOSIS — R05 Cough: Secondary | ICD-10-CM | POA: Diagnosis not present

## 2018-05-30 DIAGNOSIS — I10 Essential (primary) hypertension: Secondary | ICD-10-CM | POA: Diagnosis not present

## 2018-05-30 DIAGNOSIS — Z6831 Body mass index (BMI) 31.0-31.9, adult: Secondary | ICD-10-CM | POA: Diagnosis not present

## 2018-06-15 DIAGNOSIS — I10 Essential (primary) hypertension: Secondary | ICD-10-CM | POA: Diagnosis not present

## 2018-06-15 DIAGNOSIS — R7301 Impaired fasting glucose: Secondary | ICD-10-CM | POA: Diagnosis not present

## 2018-06-15 DIAGNOSIS — Z0000 Encounter for general adult medical examination without abnormal findings: Secondary | ICD-10-CM | POA: Diagnosis not present

## 2018-06-15 DIAGNOSIS — Z125 Encounter for screening for malignant neoplasm of prostate: Secondary | ICD-10-CM | POA: Diagnosis not present

## 2018-06-15 DIAGNOSIS — R82998 Other abnormal findings in urine: Secondary | ICD-10-CM | POA: Diagnosis not present

## 2018-06-20 DIAGNOSIS — R7301 Impaired fasting glucose: Secondary | ICD-10-CM | POA: Diagnosis not present

## 2018-06-20 DIAGNOSIS — E669 Obesity, unspecified: Secondary | ICD-10-CM | POA: Diagnosis not present

## 2018-06-20 DIAGNOSIS — Z0000 Encounter for general adult medical examination without abnormal findings: Secondary | ICD-10-CM | POA: Diagnosis not present

## 2018-06-20 DIAGNOSIS — I10 Essential (primary) hypertension: Secondary | ICD-10-CM | POA: Diagnosis not present

## 2018-06-20 DIAGNOSIS — R05 Cough: Secondary | ICD-10-CM | POA: Diagnosis not present

## 2018-06-23 DIAGNOSIS — Z1212 Encounter for screening for malignant neoplasm of rectum: Secondary | ICD-10-CM | POA: Diagnosis not present

## 2018-07-21 DIAGNOSIS — D2271 Melanocytic nevi of right lower limb, including hip: Secondary | ICD-10-CM | POA: Diagnosis not present

## 2018-07-21 DIAGNOSIS — D2272 Melanocytic nevi of left lower limb, including hip: Secondary | ICD-10-CM | POA: Diagnosis not present

## 2018-07-21 DIAGNOSIS — L820 Inflamed seborrheic keratosis: Secondary | ICD-10-CM | POA: Diagnosis not present

## 2018-07-21 DIAGNOSIS — D225 Melanocytic nevi of trunk: Secondary | ICD-10-CM | POA: Diagnosis not present

## 2019-03-14 DIAGNOSIS — Z01812 Encounter for preprocedural laboratory examination: Secondary | ICD-10-CM | POA: Diagnosis not present

## 2019-03-14 DIAGNOSIS — Z20828 Contact with and (suspected) exposure to other viral communicable diseases: Secondary | ICD-10-CM | POA: Diagnosis not present

## 2019-06-08 DIAGNOSIS — M25511 Pain in right shoulder: Secondary | ICD-10-CM | POA: Diagnosis not present

## 2019-06-08 DIAGNOSIS — M7541 Impingement syndrome of right shoulder: Secondary | ICD-10-CM | POA: Diagnosis not present

## 2019-06-08 DIAGNOSIS — G8929 Other chronic pain: Secondary | ICD-10-CM | POA: Diagnosis not present

## 2019-06-23 DIAGNOSIS — I10 Essential (primary) hypertension: Secondary | ICD-10-CM | POA: Diagnosis not present

## 2019-06-23 DIAGNOSIS — Z125 Encounter for screening for malignant neoplasm of prostate: Secondary | ICD-10-CM | POA: Diagnosis not present

## 2019-06-26 DIAGNOSIS — R82998 Other abnormal findings in urine: Secondary | ICD-10-CM | POA: Diagnosis not present

## 2019-06-26 DIAGNOSIS — I10 Essential (primary) hypertension: Secondary | ICD-10-CM | POA: Diagnosis not present

## 2019-06-27 DIAGNOSIS — R7301 Impaired fasting glucose: Secondary | ICD-10-CM | POA: Diagnosis not present

## 2019-06-27 DIAGNOSIS — Z0000 Encounter for general adult medical examination without abnormal findings: Secondary | ICD-10-CM | POA: Diagnosis not present

## 2019-06-27 DIAGNOSIS — R002 Palpitations: Secondary | ICD-10-CM | POA: Diagnosis not present

## 2019-06-27 DIAGNOSIS — R05 Cough: Secondary | ICD-10-CM | POA: Diagnosis not present

## 2019-06-27 DIAGNOSIS — Z1331 Encounter for screening for depression: Secondary | ICD-10-CM | POA: Diagnosis not present

## 2019-06-27 DIAGNOSIS — I10 Essential (primary) hypertension: Secondary | ICD-10-CM | POA: Diagnosis not present

## 2019-07-05 DIAGNOSIS — Z1212 Encounter for screening for malignant neoplasm of rectum: Secondary | ICD-10-CM | POA: Diagnosis not present

## 2019-07-27 DIAGNOSIS — D485 Neoplasm of uncertain behavior of skin: Secondary | ICD-10-CM | POA: Diagnosis not present

## 2019-07-27 DIAGNOSIS — L814 Other melanin hyperpigmentation: Secondary | ICD-10-CM | POA: Diagnosis not present

## 2019-07-27 DIAGNOSIS — D2272 Melanocytic nevi of left lower limb, including hip: Secondary | ICD-10-CM | POA: Diagnosis not present

## 2019-07-27 DIAGNOSIS — D225 Melanocytic nevi of trunk: Secondary | ICD-10-CM | POA: Diagnosis not present

## 2019-07-27 DIAGNOSIS — D2271 Melanocytic nevi of right lower limb, including hip: Secondary | ICD-10-CM | POA: Diagnosis not present

## 2019-08-14 DIAGNOSIS — Z1152 Encounter for screening for COVID-19: Secondary | ICD-10-CM | POA: Diagnosis not present

## 2019-08-14 DIAGNOSIS — R05 Cough: Secondary | ICD-10-CM | POA: Diagnosis not present

## 2019-08-14 DIAGNOSIS — J019 Acute sinusitis, unspecified: Secondary | ICD-10-CM | POA: Diagnosis not present

## 2020-01-10 DIAGNOSIS — D171 Benign lipomatous neoplasm of skin and subcutaneous tissue of trunk: Secondary | ICD-10-CM | POA: Diagnosis not present

## 2020-04-26 ENCOUNTER — Encounter: Payer: Self-pay | Admitting: Gastroenterology

## 2020-06-12 ENCOUNTER — Ambulatory Visit (AMBULATORY_SURGERY_CENTER): Payer: Self-pay | Admitting: *Deleted

## 2020-06-12 ENCOUNTER — Other Ambulatory Visit: Payer: Self-pay

## 2020-06-12 VITALS — Ht 69.0 in | Wt 227.0 lb

## 2020-06-12 DIAGNOSIS — Z800 Family history of malignant neoplasm of digestive organs: Secondary | ICD-10-CM

## 2020-06-12 DIAGNOSIS — Z86010 Personal history of colonic polyps: Secondary | ICD-10-CM

## 2020-06-12 MED ORDER — SUTAB 1479-225-188 MG PO TABS: 1.0000 | ORAL_TABLET | Freq: Once | ORAL | 0 refills | Status: AC

## 2020-06-12 NOTE — Progress Notes (Signed)
No egg or soy allergy known to patient  No issues with past sedation with any surgeries or procedures no intubation problems in the past  No FH of Malignant Hyperthermia No diet pills per patient No home 02 use per patient  No blood thinners per patient  Pt denies issues with constipation  No A fib or A flutter  EMMI video to pt or via [place] 19 guidelines implemented in PV today with Pt and RN   [employer] Coupon given to pt in PV today , Code to Pharmacy   Due to the COVID-19 pandemic we are asking patients to follow these guidelines. Please only bring one care partner. Please be aware that your care partner may wait in the car in the parking lot or if they feel like they will be too hot to wait in the car, they may wait in the lobby on the 4th floor. All care partners are required to wear a mask the entire time (we do not have any that we can provide them), they need to practice social distancing, and we will do a Covid check for all patient's and care partners when you arrive. Also we will check their temperature and your temperature. If the care partner waits in their car they need to stay in the parking lot the entire time and we will call them on their cell phone when the patient is ready for discharge so they can bring the car to the front of the building. Also all patient's will need to wear a mask into building.

## 2020-06-17 ENCOUNTER — Encounter: Payer: Self-pay | Admitting: Internal Medicine

## 2020-06-28 ENCOUNTER — Other Ambulatory Visit: Payer: Self-pay

## 2020-06-28 ENCOUNTER — Encounter: Payer: Self-pay | Admitting: Internal Medicine

## 2020-06-28 ENCOUNTER — Ambulatory Visit (AMBULATORY_SURGERY_CENTER): Payer: BC Managed Care – PPO | Admitting: Internal Medicine

## 2020-06-28 VITALS — BP 122/76 | HR 62 | Temp 96.6°F | Resp 16 | Ht 69.0 in | Wt 227.0 lb

## 2020-06-28 DIAGNOSIS — Z800 Family history of malignant neoplasm of digestive organs: Secondary | ICD-10-CM

## 2020-06-28 DIAGNOSIS — Z86010 Personal history of colonic polyps: Secondary | ICD-10-CM | POA: Diagnosis not present

## 2020-06-28 DIAGNOSIS — D124 Benign neoplasm of descending colon: Secondary | ICD-10-CM

## 2020-06-28 DIAGNOSIS — D123 Benign neoplasm of transverse colon: Secondary | ICD-10-CM

## 2020-06-28 DIAGNOSIS — D128 Benign neoplasm of rectum: Secondary | ICD-10-CM | POA: Diagnosis not present

## 2020-06-28 DIAGNOSIS — D122 Benign neoplasm of ascending colon: Secondary | ICD-10-CM | POA: Diagnosis not present

## 2020-06-28 MED ORDER — SODIUM CHLORIDE 0.9 % IV SOLN: 500.0000 mL | Freq: Once | INTRAVENOUS | Status: DC

## 2020-06-28 NOTE — Patient Instructions (Signed)
YOU HAD AN ENDOSCOPIC PROCEDURE TODAY AT [facility]:   Refer to the procedure report that was given to you for any specific questions about what was found during the examination.  If the procedure report does not answer your questions, please call your gastroenterologist to clarify.  If you requested that your care partner not be given the details of your procedure findings, then the procedure report has been included in a sealed envelope for you to review at your convenience later.  Handouts given for polyps and diverticulosis.  YOU SHOULD EXPECT: Some feelings of bloating in the abdomen. Passage of more gas than usual.  Walking can help get rid of the air that was put into your GI tract during the procedure and reduce the bloating. If you had a lower endoscopy (such as a colonoscopy or flexible sigmoidoscopy) you may notice spotting of blood in your stool or on the toilet paper. If you underwent a bowel prep for your procedure, you may not have a normal bowel movement for a few days.  Please Note:  You might notice some irritation and congestion in your nose or some drainage.  This is from the oxygen used during your procedure.  There is no need for concern and it should clear up in a day or so.  SYMPTOMS TO REPORT IMMEDIATELY:   Following lower endoscopy (colonoscopy or flexible sigmoidoscopy):  Excessive amounts of blood in the stool  Significant tenderness or worsening of abdominal pains  Swelling of the abdomen that is new, acute  Fever of 100F or higher  For urgent or emergent issues, a gastroenterologist can be reached at any hour by calling [phone]. Do not use MyChart messaging for urgent concerns.    DIET:  We do recommend a small meal at first, but then you may proceed to your regular diet.  Drink plenty of fluids but you should avoid alcoholic beverages for 24 hours.  ACTIVITY:  You should plan to take it easy for the rest of today and you should NOT DRIVE  or use heavy machinery until tomorrow (because of the sedation medicines used during the test).    FOLLOW UP: Our staff will call the number listed on your records 48-72 hours following your procedure to check on you and address any questions or concerns that you may have regarding the information given to you following your procedure. If we do not reach you, we will leave a message.  We will attempt to reach you two times.  During this call, we will ask if you have developed any symptoms of COVID 19. If you develop any symptoms (ie: fever, flu-like symptoms, shortness of breath, cough etc.) before then, please call [phone].  If you test positive for Covid 19 in the 2 weeks post procedure, please call and report this information to [place].    If any biopsies were taken you will be contacted by phone or by letter within the next 1-3 weeks.  Please call us at [phone] if you have not heard about the biopsies in 3 weeks.    SIGNATURES/CONFIDENTIALITY: You and/or your care partner have signed paperwork which will be entered into your electronic medical record.  These signatures attest to the fact that that the information above on your After Visit Summary has been reviewed and is understood.  Full responsibility of the confidentiality of this discharge information lies with you and/or your care-partner.

## 2020-06-28 NOTE — Progress Notes (Signed)
Medical history reviewed with no changes noted. VS assessed by C.W 

## 2020-06-28 NOTE — Op Note (Signed)
[facility] Patient Name: Kevin Hoffman Procedure Date: 06/28/2020 9:04 AM MRN: [id number] Endoscopist: Docia Chuck. Henrene Pastor , MD Age: 59 Referring MD:  Date of Birth: 05-05-1961 Gender: Male Account #: [id number] Procedure:                Colonoscopy with cold snare polypectomy x 7 Indications:              High risk colon cancer surveillance: Personal                            history of multiple (3 or more) adenomas. Previous                            examinations 2012, 2015, 2018. Mother with colon                            cancer in her 63s Medicines:                Monitored Anesthesia Care Procedure:                Pre-Anesthesia Assessment:                           - Prior to the procedure, a History and Physical                            was performed, and patient medications and                            allergies were reviewed. The patient's tolerance of                            previous anesthesia was also reviewed. The risks                            and benefits of the procedure and the sedation                            options and risks were discussed with the patient.                            All questions were answered, and informed consent                            was obtained. Prior Anticoagulants: The patient has                            taken no previous anticoagulant or antiplatelet                            agents. ASA Grade Assessment: II - A patient with                            mild systemic disease. After reviewing the risks  and benefits, the patient was deemed in                            satisfactory condition to undergo the procedure.                           After obtaining informed consent, the colonoscope                            was passed under direct vision. Throughout the                            procedure, the patient's blood pressure, pulse, and                            oxygen  saturations were monitored continuously. The                            Colonoscope was introduced through the anus and                            advanced to the the cecum, identified by                            appendiceal orifice and ileocecal valve. The                            ileocecal valve, appendiceal orifice, and rectum                            were photographed. The quality of the bowel                            preparation was excellent. The colonoscopy was                            performed without difficulty. The patient tolerated                            the procedure well. The bowel preparation used was                            SUPREP/tablets via split dose instruction. Scope In: 9:12:10 AM Scope Out: 9:31:58 AM Scope Withdrawal Time: 0 hours 16 minutes 5 seconds  Total Procedure Duration: 0 hours 19 minutes 48 seconds  Findings:                 Seven polyps were found in the rectum, descending                            colon, transverse colon and ascending colon. The                            polyps were 2 to 5 mm in size. These polyps were  removed with a cold snare. Resection and retrieval                            were complete.                           Multiple diverticula were found in the left colon.                           The exam was otherwise without abnormality on                            direct and retroflexion views. Complications:            No immediate complications. Estimated blood loss:                            None. Estimated Blood Loss:     Estimated blood loss: none. Impression:               - Seven 2 to 5 mm polyps in the rectum, in the                            descending colon, in the transverse colon and in                            the ascending colon, removed with a cold snare.                            Resected and retrieved.                           - Diverticulosis in the left colon.                            - The examination was otherwise normal on direct                            and retroflexion views. Recommendation:           - Repeat colonoscopy in 3 years for surveillance.                           - Patient has a contact number available for                            emergencies. The signs and symptoms of potential                            delayed complications were discussed with the                            patient. Return to normal activities tomorrow.                            Written discharge instructions were  provided to the                            patient.                           - Resume previous diet.                           - Continue present medications.                           - Await pathology results. Docia Chuck. Henrene Pastor, MD 06/28/2020 9:40:02 AM This report has been signed electronically.

## 2020-06-28 NOTE — Progress Notes (Signed)
Called to room to assist during endoscopic procedure.  Patient ID and intended procedure confirmed with present staff. Received instructions for my participation in the procedure from the performing physician.  

## 2020-06-28 NOTE — Progress Notes (Signed)
Report to PACU, RN, vss, BBS= Clear.  

## 2020-07-02 ENCOUNTER — Telehealth: Payer: Self-pay

## 2020-07-02 NOTE — Telephone Encounter (Signed)
First attempt follow up call to pt, lm on vm 

## 2020-07-02 NOTE — Telephone Encounter (Signed)
  Follow up Call-  Call back number 06/28/2020  Post procedure Call Back phone  # [phone]  Permission to leave phone message Yes  Some recent data might be hidden     2nd follow up call made.  NALM

## 2020-07-03 ENCOUNTER — Encounter: Payer: Self-pay | Admitting: Internal Medicine

## 2023-07-12 ENCOUNTER — Encounter: Payer: Self-pay | Admitting: Internal Medicine
